# Patient Record
Sex: Male | Born: 1995 | Race: White | Hispanic: No | Marital: Single | Smoking: Never smoker
Health system: Southern US, Community
[De-identification: ages and names within clinical notes are randomized; demographics above are authoritative.]

## PROBLEM LIST (undated history)

## (undated) DIAGNOSIS — K5900 Constipation, unspecified: Secondary | ICD-10-CM

## (undated) DIAGNOSIS — F419 Anxiety disorder, unspecified: Secondary | ICD-10-CM

## (undated) DIAGNOSIS — K649 Unspecified hemorrhoids: Secondary | ICD-10-CM

## (undated) DIAGNOSIS — T7840XA Allergy, unspecified, initial encounter: Secondary | ICD-10-CM

## (undated) HISTORY — DX: Allergy, unspecified, initial encounter: T78.40XA

## (undated) HISTORY — DX: Anxiety disorder, unspecified: F41.9

## (undated) HISTORY — DX: Constipation, unspecified: K59.00

## (undated) HISTORY — DX: Unspecified hemorrhoids: K64.9

## (undated) HISTORY — PX: WISDOM TOOTH EXTRACTION: SHX21

---

## 2015-04-13 ENCOUNTER — Ambulatory Visit (INDEPENDENT_AMBULATORY_CARE_PROVIDER_SITE_OTHER): Payer: 59 | Admitting: Internal Medicine

## 2015-04-13 VITALS — BP 110/72 | HR 78 | Temp 98.2°F | Resp 18 | Ht 68.0 in | Wt 130.0 lb

## 2015-04-13 DIAGNOSIS — K5900 Constipation, unspecified: Secondary | ICD-10-CM

## 2015-04-13 DIAGNOSIS — K602 Anal fissure, unspecified: Secondary | ICD-10-CM

## 2015-04-13 MED ORDER — POLYETHYLENE GLYCOL 3350 17 GM/SCOOP PO POWD: 17.0000 g | Freq: Every day | ORAL | Status: DC | PRN

## 2015-04-13 NOTE — Progress Notes (Addendum)
[facility] [address], [place] [address] [phone]  Date:  04/13/2015   Name:  Raymond Huber   DOB:  [birth year]   MRN:  [id number]  PCP:  No PCP Per Patient    History of Present Illness:  Raymond Huber is a 20 y.o. male patient who presents to [facility] with cc of anal pain.  3 weeks of off and on bleeding.  On and off spotting with episodes of bowel movements.  He became concerned when his bleeding increased with difficulty to stop today.  It eventually had.  To note, this was blood that was found on the toilet paper, and not in stool.  The pain is tearing and poking like moreso with bowel movements.  He has constipation.  BM are qd to every other day.  He was not hydrating well, however started once this constipation and bleeding started 3 weeks ago.  He has no hx of abdominal pain.  No diarrhea or other episodes of blood in the stool.  No familial hx of IBD.  He is a Printmaker, living on campus--on meal plan.  He eats fastfood like items (pastas, french fries, hamburgers).  Rarely gets vegetables.  He has never had a problem with constipation prior to college experience.      Freshman at [employer]   There are no active problems to display for this patient.   Past Medical History  Diagnosis Date  . Allergy     History reviewed. No pertinent past surgical history.  Social History  Substance Use Topics  . Smoking status: Never Smoker   . Smokeless tobacco: None  . Alcohol Use: None    Family History  Problem Relation Age of Onset  . Cancer Paternal Grandmother   . Cancer Paternal Grandfather     No Known Allergies  Medication list has been reviewed and updated.  No current outpatient prescriptions on file prior to visit.   No current facility-administered medications on file prior to visit.    ROS ROS otherwise unremarkable unless listed above.  Physical Examination: BP 110/72 mmHg  Pulse 78  Temp(Src) 98.2 F (36.8 C)  (Oral)  Resp 18  Ht  (1.727 m)  Wt 130 lb (58.968 kg)  BMI 19.77 kg/m2  SpO2 98% Ideal Body Weight: Weight in (lb) to have BMI = 25: 164.1  Physical Exam  Constitutional: He is oriented to person, place, and time. He appears well-developed and well-nourished. No distress.  HENT:  Head: Normocephalic and atraumatic.  Eyes: Conjunctivae and EOM are normal. Pupils are equal, round, and reactive to light.  Cardiovascular: Normal rate.   Pulmonary/Chest: Effort normal. No respiratory distress.  Genitourinary: Rectal exam shows fissure (tear at the 4 o'clock region consistent with anal fissure.) and tenderness. Rectal exam shows no external hemorrhoid and no internal hemorrhoid.  Neurological: He is alert and oriented to person, place, and time.  Skin: Skin is warm and dry. He is not diaphoretic.  Psychiatric: He has a normal mood and affect. His behavior is normal.     Assessment and Plan: Raymond Huber is a 20 y.o. male who is here today  --advised fibrous diet, sitz bath, and given stool softener at this time. --given nifedipine gel tid prn for anus at this time.   --rtc in 7-10 days if no improvement. Anal fissure  Constipation, unspecified constipation type - Plan: polyethylene glycol powder (GLYCOLAX/MIRALAX) powder  Trena Platt, PA-C [facility] 04/13/2015 2:53 PM  I have participated in the care of this patient with the Advanced Practice Provider and agree with Diagnosis and Plan as documented. Robert P. Merla Richesoolittle, M.D.

## 2015-04-13 NOTE — Patient Instructions (Addendum)
IF you received an x-ray today, you will receive an invoice from [facility]. Please contact [facility] at [phone] with questions or concerns regarding your invoice.   IF you received labwork today, you will receive an invoice from [facility]. Please contact [facility] at [phone] with questions or concerns regarding your invoice.   Our billing staff will not be able to assist you with questions regarding bills from these companies.  You will be contacted with the lab results as soon as they are available. The fastest way to get your results is to activate your My Chart account. Instructions are located on the last page of this paperwork. If you have not heard from us regarding the results in 2 weeks, please contact this office.    Please try to include fiber in your diet.  I am including fibrous food for you to consider at this time.   I would like you to make sure that you are hydrating with 64 oz or more of water daily. Do warm sitz baths if you are able to at this time.   High-Fiber Diet Fiber, also called dietary fiber, is a type of carbohydrate found in fruits, vegetables, whole grains, and beans. A high-fiber diet can have many health benefits. Your health care provider may recommend a high-fiber diet to help:  Prevent constipation. Fiber can make your bowel movements more regular.  Lower your cholesterol.  Relieve hemorrhoids, uncomplicated diverticulosis, or irritable bowel syndrome.  Prevent overeating as part of a weight-loss plan.  Prevent heart disease, type 2 diabetes, and certain cancers. WHAT IS MY PLAN? The recommended daily intake of fiber includes:  38 grams for men under age 20.  30 grams for men over age 20.  25 grams for women under age 20.  21 grams for women over age 20. You can get the recommended daily intake of dietary fiber by eating a variety of fruits, vegetables, grains, and beans. Your health  care provider may also recommend a fiber supplement if it is not possible to get enough fiber through your diet. WHAT DO I NEED TO KNOW ABOUT A HIGH-FIBER DIET?  Fiber supplements have not been widely studied for their effectiveness, so it is better to get fiber through food sources.  Always check the fiber content on thenutrition facts label of any prepackaged food. Look for foods that contain at least 5 grams of fiber per serving.  Ask your dietitian if you have questions about specific foods that are related to your condition, especially if those foods are not listed in the following section.  Increase your daily fiber consumption gradually. Increasing your intake of dietary fiber too quickly may cause bloating, cramping, or gas.  Drink plenty of water. Water helps you to digest fiber. WHAT FOODS CAN I EAT? Grains Whole-grain breads. Multigrain cereal. Oats and oatmeal. Brown rice. Barley. Bulgur wheat. Millet. Bran muffins. Popcorn. Rye wafer crackers. Vegetables Sweet potatoes. Spinach. Kale. Artichokes. Cabbage. Broccoli. Green peas. Carrots. Squash. Fruits Berries. Pears. Apples. Oranges. Avocados. Prunes and raisins. Dried figs. Meats and Other Protein Sources Navy, kidney, pinto, and soy beans. Split peas. Lentils. Nuts and seeds. Dairy Fiber-fortified yogurt. Beverages Fiber-fortified soy milk. Fiber-fortified orange juice. Other Fiber bars. The items listed above may not be a complete list of recommended foods or beverages. Contact your dietitian for more options. WHAT FOODS ARE NOT RECOMMENDED? Grains White bread. Pasta made with refined flour. White rice. Vegetables Fried potatoes. Canned vegetables. Well-cooked vegetables.  Fruits Fruit  juice. Cooked, strained fruit. Meats and Other Protein Sources Fatty cuts of meat. Fried Environmental education officer or fried fish. Dairy Milk. Yogurt. Cream cheese. Sour cream. Beverages Soft drinks. Other Cakes and pastries. Butter and  oils. The items listed above may not be a complete list of foods and beverages to avoid. Contact your dietitian for more information. WHAT ARE SOME TIPS FOR INCLUDING HIGH-FIBER FOODS IN MY DIET?  Eat a wide variety of high-fiber foods.  Make sure that half of all grains consumed each day are whole grains.  Replace breads and cereals made from refined flour or white flour with whole-grain breads and cereals.  Replace white rice with brown rice, bulgur wheat, or millet.  Start the day with a breakfast that is high in fiber, such as a cereal that contains at least 5 grams of fiber per serving.  Use beans in place of meat in soups, salads, or pasta.  Eat high-fiber snacks, such as berries, raw vegetables, nuts, or popcorn.   This information is not intended to replace advice given to you by your health care provider. Make sure you discuss any questions you have with your health care provider.   Document Released: 12/19/2004 Document Revised: 01/09/2014 Document Reviewed: 06/03/2013 Elsevier Interactive Patient Education 2016 Elsevier Inc.  Anal Fissure, Adult An anal fissure is a small tear or crack in the skin around the opening of the butt (anus).Bleeding from the tear or crack usually stops on its own within a few minutes. The bleeding may happen every time you poop (have a bowel movement) until the tear or crack heals. HOME CARE Eating and Drinking  Avoid bananas and dairy products. These foods can make it hard to poop.  Drink enough fluid to keep your pee (urine) clear or pale yellow.  Eat a lot of fruit, whole grains, and vegetables. General Instructions  Keep the butt area as clean and dry as you can.  Take a warm water bath (sitz bath) as told by your doctor. Do not use soap.  Take over-the-counter and prescription medicines only as told by your doctor.  Use creams or ointments only as told by your doctor.  Keep all follow-up visits as told by your doctor. This is  important. GET HELP IF:  You have more bleeding.  You have a fever.  You have watery poop (diarrhea) that is mixed with blood.  You have pain.  You problem gets worse, not better.   This information is not intended to replace advice given to you by your health care provider. Make sure you discuss any questions you have with your health care provider.   Document Released: 08/17/2010 Document Revised: 09/09/2014 Document Reviewed: 03/16/2014 Elsevier Interactive Patient Education [employer]

## 2016-02-15 ENCOUNTER — Ambulatory Visit (INDEPENDENT_AMBULATORY_CARE_PROVIDER_SITE_OTHER): Payer: 59

## 2016-02-15 ENCOUNTER — Ambulatory Visit (INDEPENDENT_AMBULATORY_CARE_PROVIDER_SITE_OTHER): Payer: 59 | Admitting: Physician Assistant

## 2016-02-15 VITALS — BP 118/62 | HR 73 | Temp 97.8°F | Resp 18 | Ht 68.0 in | Wt 131.0 lb

## 2016-02-15 DIAGNOSIS — S99912A Unspecified injury of left ankle, initial encounter: Secondary | ICD-10-CM

## 2016-02-15 MED ORDER — MELOXICAM 15 MG PO TABS: 15.0000 mg | ORAL_TABLET | Freq: Every day | ORAL | 0 refills | Status: DC

## 2016-02-15 NOTE — Progress Notes (Signed)
   Raymond Oaksndrew Tuckey  MRN: [id number] DOB: 10/23/1995  Subjective:  Raymond Huber is a 21 y.o. male seen in office today for a chief complaint of left ankle foot injury x 5 days ago while playing basketball. Notes he inverted his left ankle and immediately heard a pop. Has associated swelling, bruising, pain and decreased ROM. Denies loss of sensation, numbness, and tingling. Pt has tried rest, ice, compression, and elevation.   Review of Systems  Constitutional: Negative for chills, diaphoresis, fatigue and fever.    There are no active problems to display for this patient.   No current outpatient prescriptions on file prior to visit.   No current facility-administered medications on file prior to visit.     No Known Allergies   Objective:  BP 118/62   Pulse 73   Temp 97.8 F (36.6 C) (Oral)   Resp 18   Ht 5\' 8"  (1.727 m)   Wt 131 lb (59.4 kg)   SpO2 100%   BMI 19.92 kg/m   Physical Exam  Constitutional: He is oriented to person, place, and time and well-developed, well-nourished, and in no distress.  HENT:  Head: Normocephalic and atraumatic.  Eyes: Conjunctivae are normal.  Neck: Normal range of motion.  Pulmonary/Chest: Effort normal.  Musculoskeletal:       Right ankle: Normal.       Left ankle: He exhibits decreased range of motion, swelling and ecchymosis (on lateral aspect). He exhibits normal pulse. Tenderness. Lateral malleolus, AITFL and head of 5th metatarsal tenderness found. Achilles tendon normal.       Right foot: Normal.       Left foot: There is normal capillary refill.  Neurological: He is alert and oriented to person, place, and time. Gait normal.  Skin: Skin is warm and dry.  Psychiatric: Affect normal.  Vitals reviewed.  Dg Ankle Complete Left  Result Date: 02/15/2016 CLINICAL DATA:  Inversion injury 5 days ago. EXAM: LEFT ANKLE COMPLETE - 3+ VIEW COMPARISON:  None. FINDINGS: There is no evidence of fracture, dislocation, or joint effusion.  There is no evidence of arthropathy or other focal bone abnormality. Soft tissues are unremarkable. IMPRESSION: Negative. Electronically Signed   By: Charlett NoseKevin  Dover M.D.   On: 02/15/2016 10:16   Dg Foot Complete Left  Result Date: 02/15/2016 CLINICAL DATA:  Foot inversion injury 5 days ago EXAM: LEFT FOOT - COMPLETE 3+ VIEW COMPARISON:  None. FINDINGS: There is no evidence of fracture or dislocation. There is no evidence of arthropathy or other focal bone abnormality. Soft tissues are unremarkable. IMPRESSION: Negative. Electronically Signed   By: Charlett NoseKevin  Dover M.D.   On: 02/15/2016 10:16    Assessment and Plan :  1. Injury of left ankle, initial encounter Plain films are reassuring, will treat as ankle sprain. Given educational material for stretches. ASO applied in office. Informed to continue rest, ice, and elevation. Instructed to return to clinic if symptoms worsen, do not improve in 10-14 days, or as needed - DG Foot Complete Left; Future - DG Ankle Complete Left; Future - meloxicam (MOBIC) 15 MG tablet; Take 1 tablet (15 mg total) by mouth daily.  Dispense: 30 tablet; Refill: 0  Benjiman CoreBrittany Surabhi Gadea PA-C  [facility] 02/15/2016 10:28 AM

## 2016-02-15 NOTE — Patient Instructions (Addendum)
Continue taking meloxicam, using ice to affected area, and using ankle brace until pain and swelling resolve. If you are still having pain in 10-14 days return to our clinic. If any symptoms worsen, seek care sooner. Thank you for letting me participate in your health and well being.   Ankle Sprain, Phase I Rehab Ask your health care provider which exercises are safe for you. Do exercises exactly as told by your health care provider and adjust them as directed. It is normal to feel mild stretching, pulling, tightness, or discomfort as you do these exercises, but you should stop right away if you feel sudden pain or your pain gets worse.Do not begin these exercises until told by your health care provider. Stretching and range of motion exercises These exercises warm up your muscles and joints and improve the movement and flexibility of your lower leg and ankle. These exercises also help to relieve pain and stiffness. Exercise A: Gastroc and soleus stretch 1. Sit on the floor with your left / right leg extended. 2. Loop a belt or towel around the ball of your left / right foot. The ball of your foot is on the walking surface, right under your toes. 3. Keep your left / right ankle and foot relaxed and keep your knee straight while you use the belt or towel to pull your foot toward you. You should feel a gentle stretch behind your calf or knee. 4. Hold this position for __________ seconds, then release to the starting position. Repeat the exercise with your knee bent. You can put a pillow or a rolled bath towel under your knee to support it. You should feel a stretch deep in your calf or at your Achilles tendon. Repeat each stretch __________ times. Complete these stretches __________ times a day. Exercise B: Ankle alphabet 1. Sit with your left / right leg supported at the lower leg.  Do not rest your foot on anything.  Make sure your foot has room to move freely. 2. Think of your left / right foot  as a paintbrush, and move your foot to trace each letter of the alphabet in the air. Keep your hip and knee still while you trace. Make the letters as large as you can without feeling discomfort. 3. Trace every letter from A to Z. Repeat __________ times. Complete this exercise __________ times a day. Strengthening exercises These exercises build strength and endurance in your ankle and lower leg. Endurance is the ability to use your muscles for a long time, even after they get tired. Exercise C: Dorsiflexors 1. Secure a rubber exercise band or tube to an object, such as a table leg, that will stay still when the band is pulled. Secure the other end around your left / right foot. 2. Sit on the floor facing the object, with your left / right leg extended. The band or tube should be slightly tense when your foot is relaxed. 3. Slowly bring your foot toward you, pulling the band tighter. 4. Hold this position for __________ seconds. 5. Slowly return your foot to the starting position. Repeat __________ times. Complete this exercise __________ times a day. Exercise D: Plantar flexors 1. Sit on the floor with your left / right leg extended. 2. Loop a rubber exercise tube or band around the ball of your left / right foot. The ball of your foot is on the walking surface, right under your toes.  Hold the ends of the band or tube in your hands.  The band or tube should be slightly tense when your foot is relaxed. 3. Slowly point your foot and toes downward, pushing them away from you. 4. Hold this position for __________ seconds. 5. Slowly return your foot to the starting position. Repeat __________ times. Complete this exercise __________ times a day. Exercise E: Evertors 1. Sit on the floor with your legs straight out in front of you. 2. Loop a rubber exercise band or tube around the ball of your left / right foot. The ball of your foot is on the walking surface, right under your toes.  Hold the  ends of the band in your hands, or secure the band to a stable object.  The band or tube should be slightly tense when your foot is relaxed. 3. Slowly push your foot outward, away from your other leg. 4. Hold this position for __________ seconds. 5. Slowly return your foot to the starting position. Repeat __________ times. Complete this exercise __________ times a day. This information is not intended to replace advice given to you by your health care provider. Make sure you discuss any questions you have with your health care provider. Document Released: 07/20/2004 Document Revised: 08/26/2015 Document Reviewed: 11/02/2014 Elsevier Interactive Patient Education  2017 [employer].    IF you received an x-ray today, you will receive an invoice from [facility]. Please contact [facility] at [phone] with questions or concerns regarding your invoice.   IF you received labwork today, you will receive an invoice from [place]. Please contact [facility] at [phone] with questions or concerns regarding your invoice.   Our billing staff will not be able to assist you with questions regarding bills from these companies.  You will be contacted with the lab results as soon as they are available. The fastest way to get your results is to activate your My Chart account. Instructions are located on the last page of this paperwork. If you have not heard from [place] regarding the results in 2 weeks, please contact this office.

## 2017-04-06 IMAGING — DX DG ANKLE COMPLETE 3+V*L*
4 series · 4 of 4 positions shown · non-contrast
Comparison: None.

CLINICAL DATA: Inversion injury 5 days ago.

EXAM:
LEFT ANKLE COMPLETE - 3+ VIEW

[ankle ap]
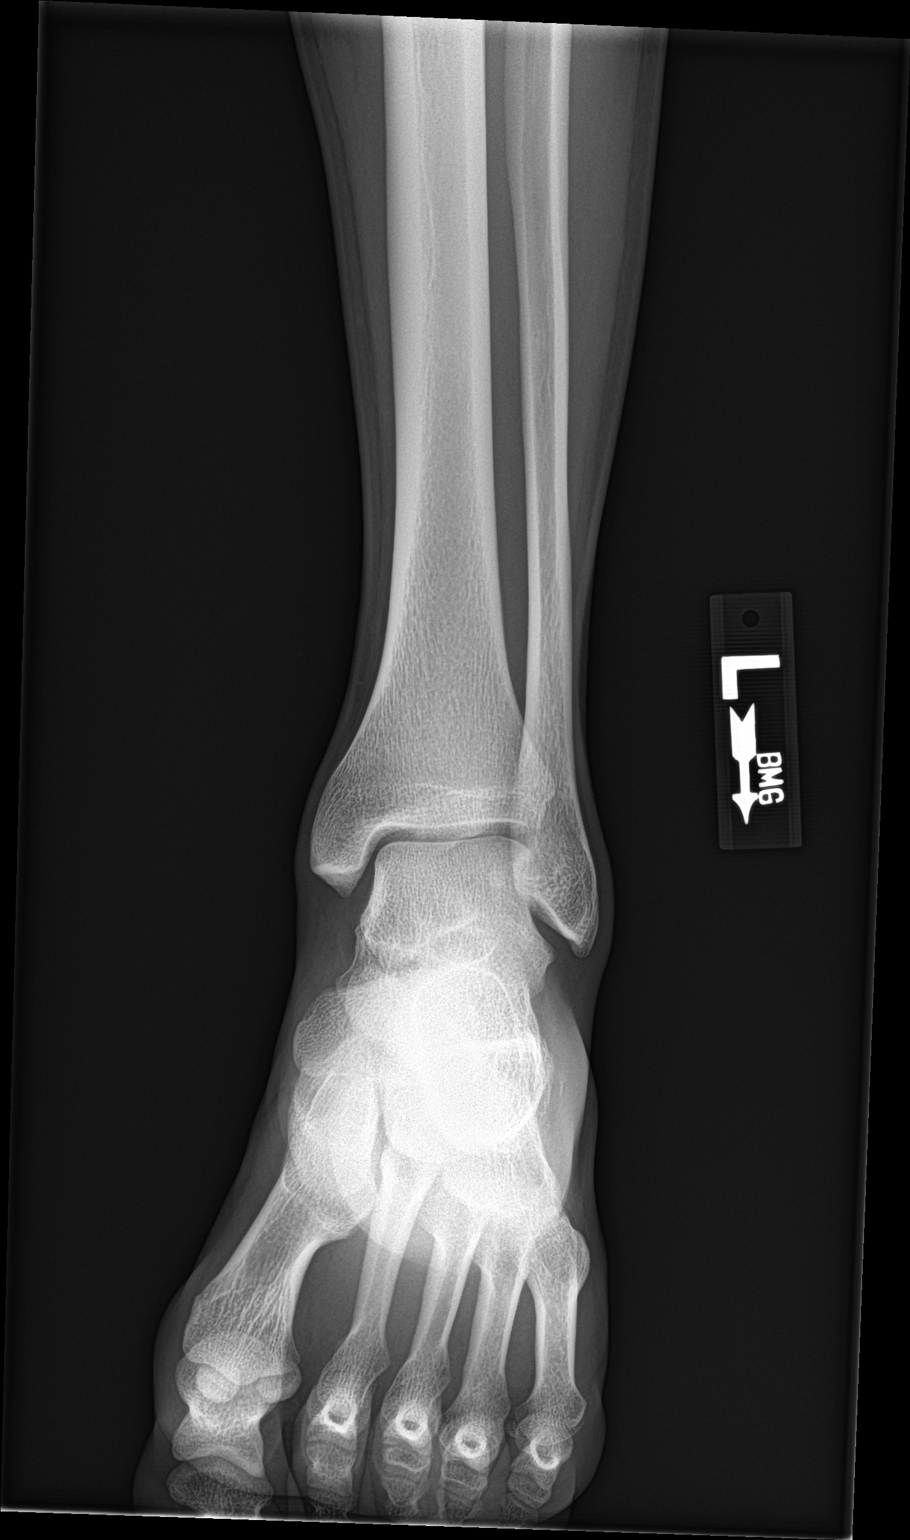

[ankle obl (1 of 2)]
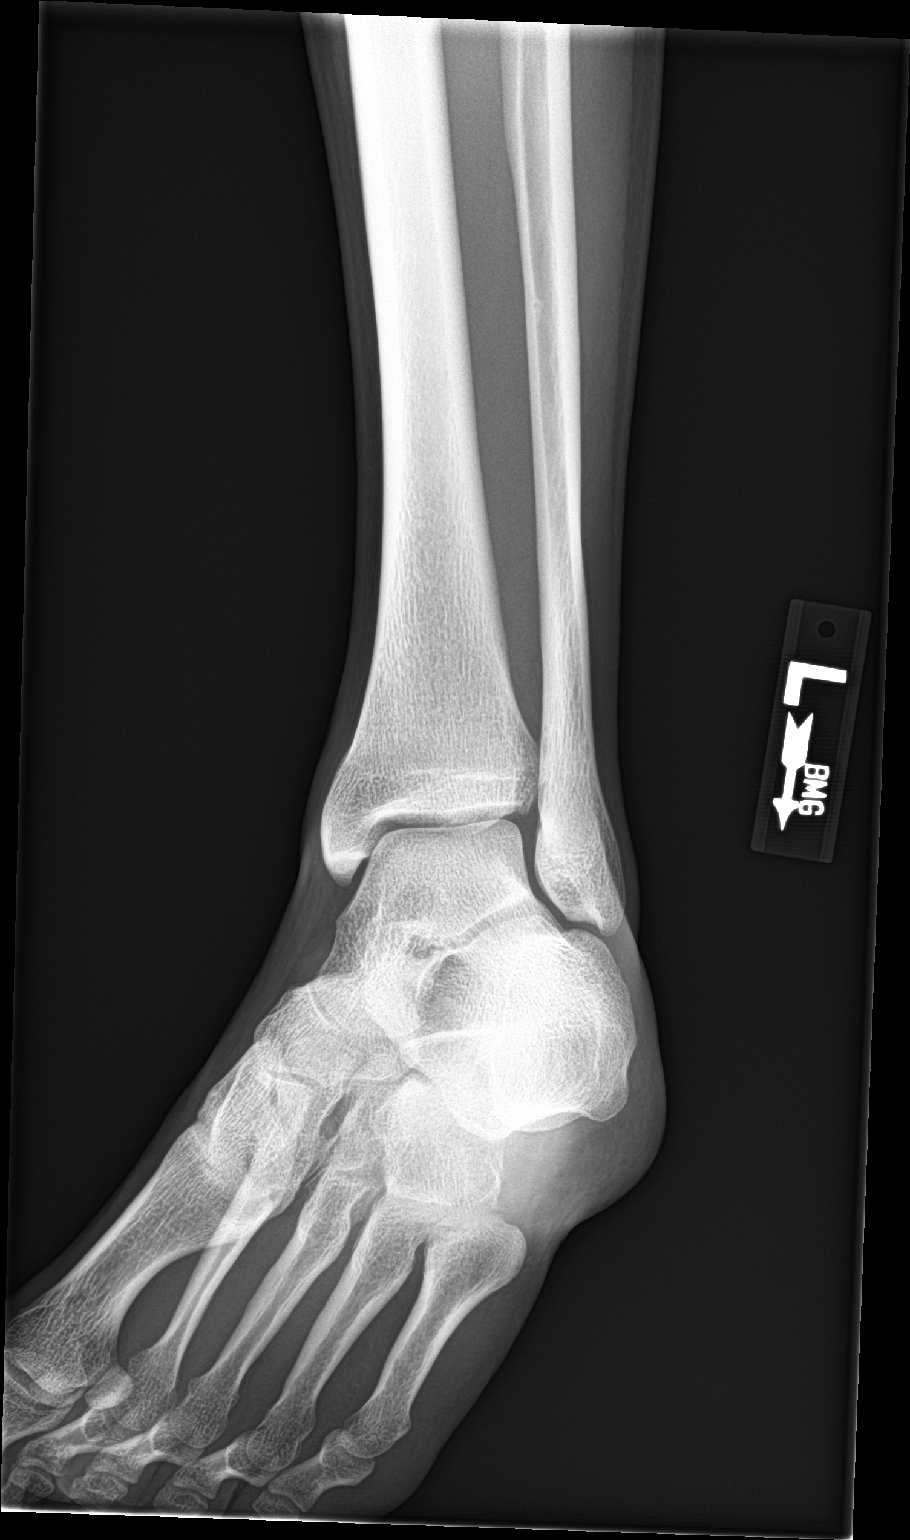

[ankle lat]
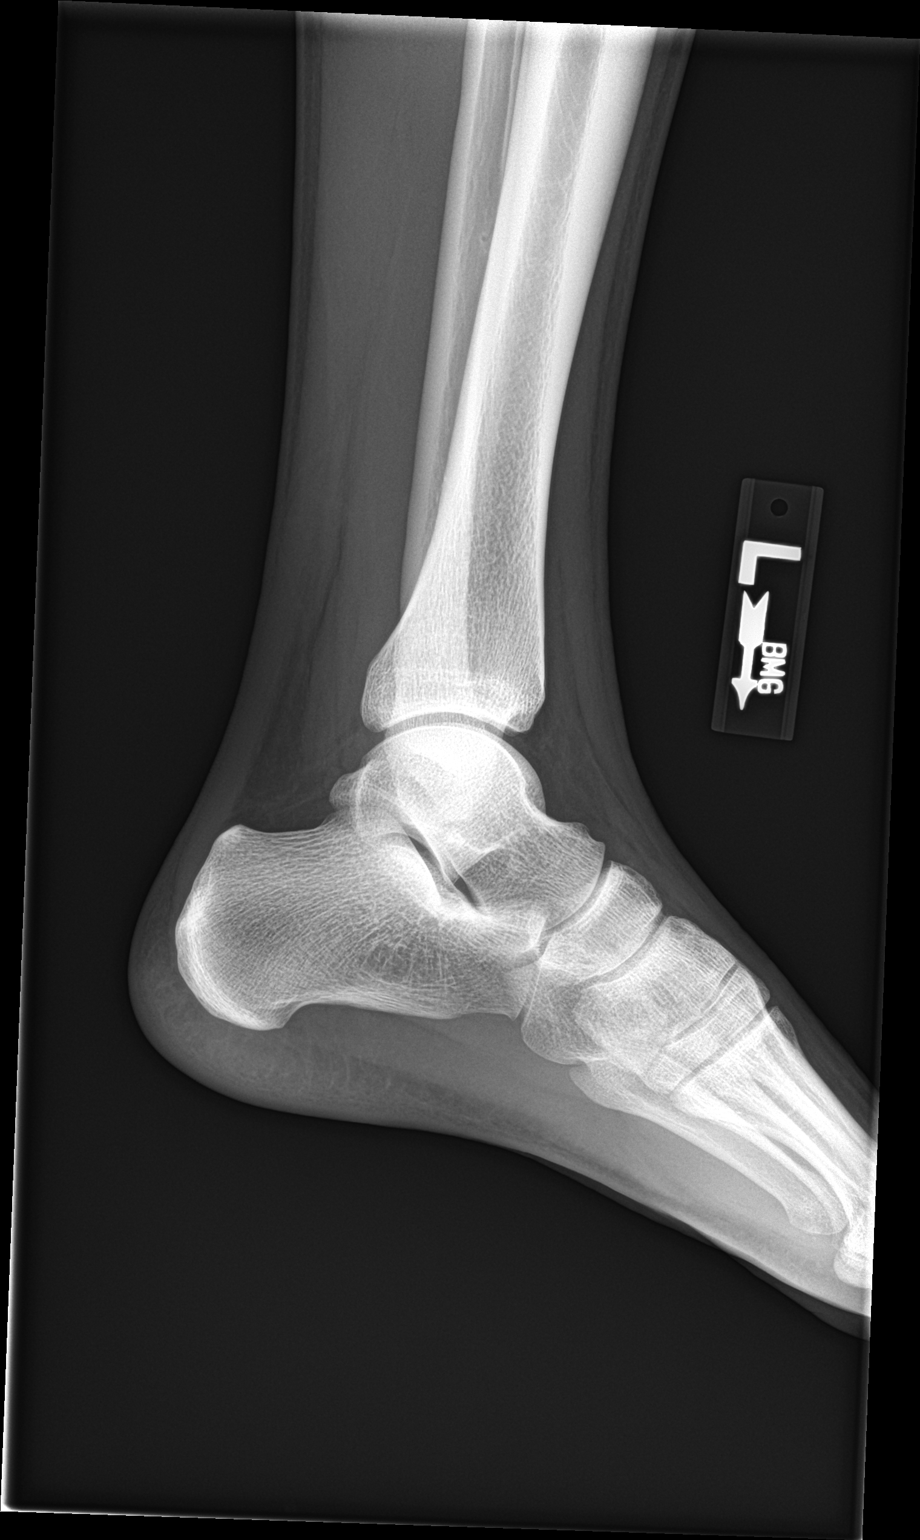

[ankle obl (2 of 2)]
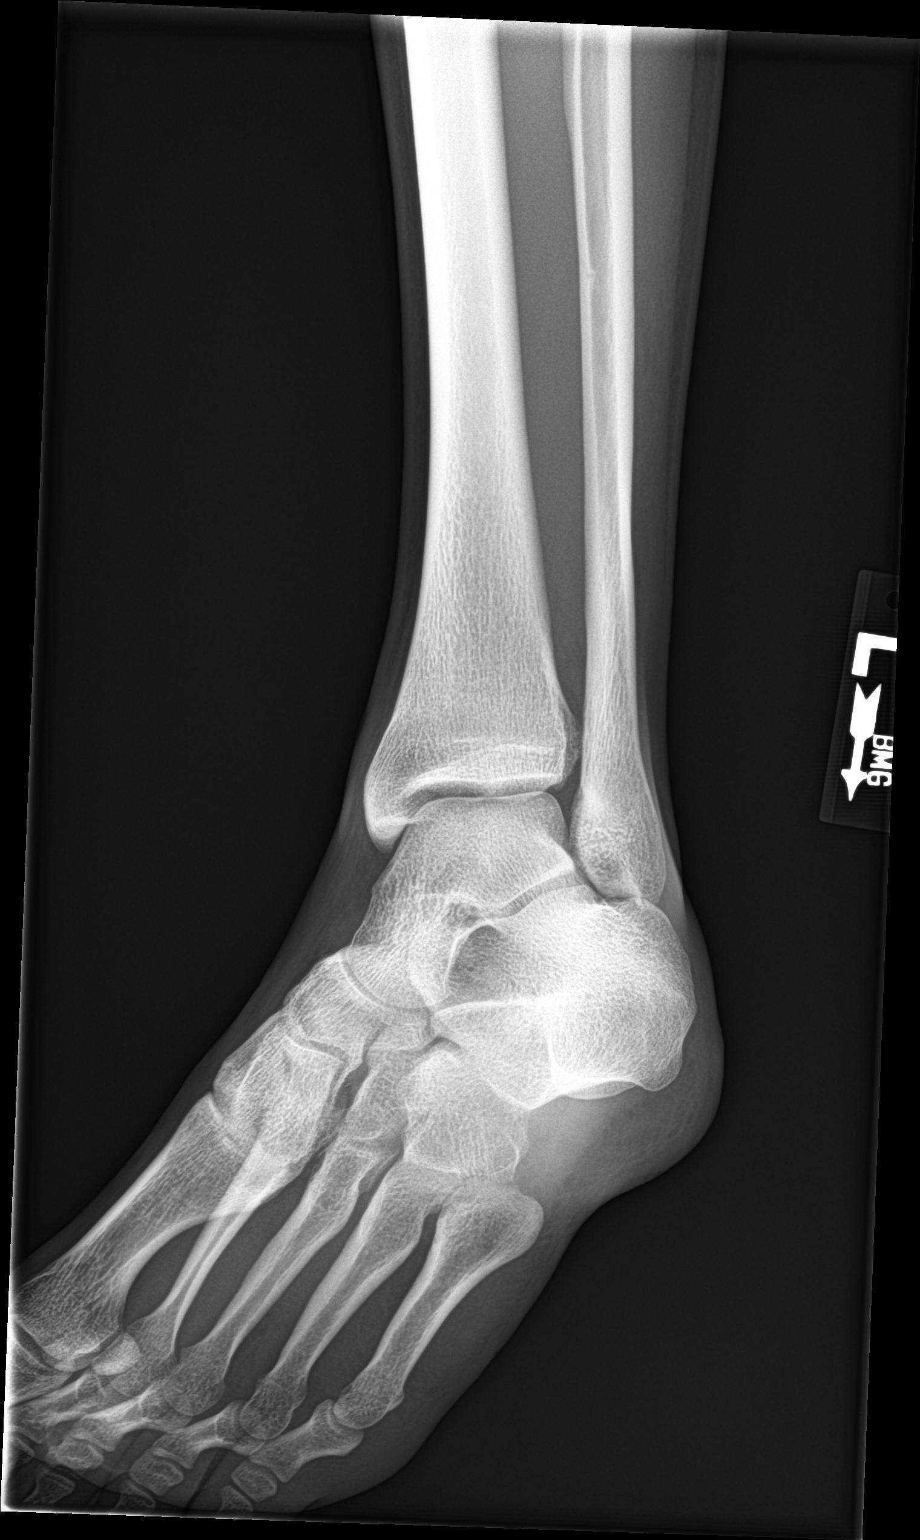

[4 of 4 positions shown; findings below may reference images not displayed]

FINDINGS: There is no evidence of fracture, dislocation, or joint effusion.
There is no evidence of arthropathy or other focal bone abnormality.
Soft tissues are unremarkable.
IMPRESSION: Negative.

## 2020-01-17 ENCOUNTER — Other Ambulatory Visit: Payer: Managed Care, Other (non HMO)

## 2020-01-17 DIAGNOSIS — Z20822 Contact with and (suspected) exposure to covid-19: Secondary | ICD-10-CM

## 2020-01-20 LAB — NOVEL CORONAVIRUS, NAA: SARS-CoV-2, NAA: NOT DETECTED

## 2021-03-01 NOTE — Progress Notes (Signed)
New Patient Office Visit  Subjective:  Patient ID: Raymond Huber, male    DOB: May 14, 1995  Age: 26 y.o. MRN: [id number]  CC:  Chief Complaint  Patient presents with   Establish Care    Np. Est care. Pt requesting CPE & blood work. Pt c/o chronic constipation x1 yr     HPI Raymond Huber presents for new patient visit to establish care.  Introduced to Designer, [employer]ellery role and practice setting.  All questions answered.  Discussed provider/patient relationship and expectations. This is his first time seeing a primary care provider in many years.   He has a history of constipation. In college he has a history of anal fissure. He was given a cream to use and stool softeners, however when he ran out of the medication, he stopped taking anything. The past 2 or 3 months he has been having constipation. He endorses having several bowel movements this week which helped his symptoms. He drinks plenty of water. He has intermittent, sharp, abdominal pain when he is constipated. He denies nausea and vomiting.   He was also diagnosed with social anxiety in college. He states that recently his anxiety has worsened.   ANXIETY/STRESS  Duration:uncontrolled Anxious mood: yes  Excessive worrying: yes Irritability:  at times   Sweating: no Nausea: no Palpitations:yes - with panic attacks Hyperventilation: yes - with panic attacks Panic attacks: yes - occasional Agoraphobia: yes  Obscessions/compulsions: no Depressed mood: yes Depression screen [facility] 2/9 03/02/2021 02/15/2016 04/13/2015  Decreased Interest 1 0 0  Down, Depressed, Hopeless 1 0 0  PHQ - 2 Score 2 0 0  Altered sleeping 1 - -  Tired, decreased energy 1 - -  Change in appetite 0 - -  Feeling bad or failure about yourself  1 - -  Trouble concentrating 3 - -  Moving slowly or fidgety/restless 1 - -  Suicidal thoughts 1 - -  PHQ-9 Score 10 - -  Difficult doing work/chores Very difficult - -   GAD 7 : Generalized Anxiety Score  03/02/2021  Nervous, Anxious, on Edge 3  Control/stop worrying 2  Worry too much - different things 3  Trouble relaxing 2  Restless 3  Easily annoyed or irritable 1  Afraid - awful might happen 1  Total GAD 7 Score 15  Anxiety Difficulty Somewhat difficult    Anhedonia: no Weight changes: yes weight loss, but recently had wisdom teeth removed Insomnia: yes hard to fall asleep  Hypersomnia: no Fatigue/loss of energy: no Feelings of worthlessness: yes Feelings of guilt: yes Impaired concentration/indecisiveness: yes Suicidal ideations: no  Crying spells: no Recent Stressors/Life Changes: yes  Past Medical History:  Diagnosis Date   Allergy    Anxiety    Constipation    Hemorrhoid     Past Surgical History:  Procedure Laterality Date   WISDOM TOOTH EXTRACTION Bilateral     Family History  Problem Relation Age of Onset   Mood Disorder Father    Cancer Paternal Grandmother        breast   Cancer Paternal Grandfather        pancreatic    Social History   Socioeconomic History   Marital status: Single    Spouse name: Not on file   Number of children: Not on file   Years of education: Not on file   Highest education level: Not on file  Occupational History   Not on file  Tobacco Use   Smoking status: Never   Smokeless  tobacco: Never  Vaping Use   Vaping Use: Former  Substance and Sexual Activity   Alcohol use: Yes    Alcohol/week: 1.0 - 2.0 standard drink    Types: 1 - 2 Cans of beer per week   Drug use: Yes    Types: Marijuana    Comment: CBD   Sexual activity: Not on file  Other Topics Concern   Not on file  Social History Narrative   Not on file   Social Determinants of Health   Financial Resource Strain: Not on file  Food Insecurity: Not on file  Transportation Needs: Not on file  Physical Activity: Not on file  Stress: Not on file  Social Connections: Not on file  Intimate Partner Violence: Not on file    ROS Review of Systems   Constitutional: Negative.   HENT: Negative.    Eyes: Negative.   Respiratory: Negative.    Cardiovascular: Negative.   Gastrointestinal:  Positive for abdominal pain (intermittent) and constipation. Negative for nausea and vomiting.  Endocrine: Positive for cold intolerance. Negative for polydipsia, polyphagia and polyuria.  Genitourinary: Negative.   Musculoskeletal: Negative.   Skin: Negative.   Neurological: Negative.   Psychiatric/Behavioral:  The patient is nervous/anxious.    Objective:   Today's Vitals: BP 111/85    Pulse 80    Temp 98.2 F (36.8 C) (Temporal)    Ht 5\' 8"  (1.727 m)    Wt 119 lb 12.8 oz (54.3 kg)    SpO2 99%    BMI 18.22 kg/m   Physical Exam Vitals and nursing note reviewed.  Constitutional:      Appearance: Normal appearance.  HENT:     Head: Normocephalic and atraumatic.     Right Ear: Tympanic membrane, ear canal and external ear normal.     Left Ear: Tympanic membrane, ear canal and external ear normal.     Nose: Nose normal.     Mouth/Throat:     Mouth: Mucous membranes are moist.     Pharynx: Oropharynx is clear.  Eyes:     Conjunctiva/sclera: Conjunctivae normal.  Cardiovascular:     Rate and Rhythm: Normal rate and regular rhythm.     Pulses: Normal pulses.     Heart sounds: Normal heart sounds.  Pulmonary:     Effort: Pulmonary effort is normal.     Breath sounds: Normal breath sounds.  Abdominal:     General: Bowel sounds are normal.     Palpations: Abdomen is soft.     Tenderness: There is no abdominal tenderness.  Musculoskeletal:        General: Normal range of motion.     Cervical back: Normal range of motion and neck supple. No tenderness.     Right lower leg: No edema.     Left lower leg: No edema.  Lymphadenopathy:     Cervical: No cervical adenopathy.  Skin:    General: Skin is warm and dry.  Neurological:     General: No focal deficit present.     Mental Status: He is alert and oriented to person, place, and time.      Cranial Nerves: No cranial nerve deficit.     Motor: No weakness.     Gait: Gait normal.     Deep Tendon Reflexes: Reflexes normal.  Psychiatric:        Mood and Affect: Mood normal.        Behavior: Behavior normal.        Thought Content:  Thought content normal.        Judgment: Judgment normal.    Assessment & Plan:   Problem List Items Addressed This Visit       Other   Anxiety    Uncontrolled. PHQ 9 is a 10 and GAD 7 is a 15.  Denies SI/HI.  He states that in the past he did have 1 time where he thought about harming himself but he called and talked to his family and since then has not had this happen.  He is able to contract for safety today.  Will start Zoloft 50 mg daily.  Start with half a tablet daily for 7 days then increase to 1 tablet daily.  He states that he has done therapy in the past and has access to online therapy.  We will check TSH today.  Follow-up in 4 weeks or sooner with concerns.      Relevant Medications   sertraline (ZOLOFT) 50 MG tablet   Other Relevant Orders   TSH   Depression, major, single episode, mild (HCC)    Uncontrolled. PHQ 9 is a 10 and GAD 7 is a 15.  Denies SI/HI.  He states that in the past he did have 1 time where he thought about harming himself but he called and talked to his family and since then has not had this happen.  He is able to contract for safety today.  Will start Zoloft 50 mg daily.  Start with half a tablet daily for 7 days then increase to 1 tablet daily.  He states that he has done therapy in the past and has access to online therapy.  Follow-up in 4 weeks or sooner with concerns.      Relevant Medications   sertraline (ZOLOFT) 50 MG tablet   Constipation    Chronic, ongoing.  He states his bowels have moved several times this week which have improved.  Encouraged him to eat foods high in fiber and drink plenty of water.  Also he can start taking MiraLAX 1 dose daily.  Follow-up with any concerns.      Other Visit  Diagnoses     Routine general medical examination at a health care facility    -  Primary   health maintenance reviewed and updated. Check CMP, CBC today. Updated HPV and flu vaccines.    Relevant Orders   CBC   Comprehensive metabolic panel   Encounter for lipid screening for cardiovascular disease       Check baseline lipid panel today   Relevant Orders   Lipid panel   Screen for STD (sexually transmitted disease)       Screen STD panel. Discussed safe sex with condom use.    Relevant Orders   HIV Antibody (routine testing w rflx)   RPR   HSV(herpes simplex vrs) 1+2 ab-IgG   Hepatitis C antibody   Urine cytology ancillary only   Need for HPV vaccine       First HPV vaccine given today.   Relevant Orders   HPV 9-valent vaccine,Recombinat (Completed)   Need for influenza vaccination       Flu vaccine given today.    Relevant Orders   Flu Vaccine QUAD 21mo+IM (Fluarix, Fluzone & Alfiuria Quad PF) (Completed)       Outpatient Encounter Medications as of 03/02/2021  Medication Sig   sertraline (ZOLOFT) 50 MG tablet Take 1 tablet (50 mg total) by mouth daily. Start 1/2 tablet daily for 7 days, then  take 1 tablet daily   [DISCONTINUED] amoxicillin (AMOXIL) 500 MG tablet Take 500 mg by mouth 3 (three) times daily. (Patient not taking: Reported on 03/02/2021)   [DISCONTINUED] meloxicam (MOBIC) 15 MG tablet Take 1 tablet (15 mg total) by mouth daily.   [DISCONTINUED] oxyCODONE-acetaminophen (PERCOCET/ROXICET) 5-325 MG tablet Take 1 tablet by mouth every 4 (four) hours as needed. (Patient not taking: Reported on 03/02/2021)   No facility-administered encounter medications on file as of 03/02/2021.    Follow-up: Return in about 4 weeks (around 03/30/2021) for Anxiety, Depression.   Charyl Dancer, NP

## 2021-03-02 ENCOUNTER — Ambulatory Visit (INDEPENDENT_AMBULATORY_CARE_PROVIDER_SITE_OTHER): Payer: 59 | Admitting: Nurse Practitioner

## 2021-03-02 ENCOUNTER — Other Ambulatory Visit (HOSPITAL_COMMUNITY)
Admission: RE | Admit: 2021-03-02 | Discharge: 2021-03-02 | Disposition: A | Discharge status: Home / Self Care | Payer: Managed Care, Other (non HMO) | Source: Ambulatory Visit | Attending: Nurse Practitioner | Admitting: Nurse Practitioner

## 2021-03-02 ENCOUNTER — Other Ambulatory Visit: Payer: Self-pay

## 2021-03-02 ENCOUNTER — Encounter: Payer: Self-pay | Admitting: Nurse Practitioner

## 2021-03-02 VITALS — BP 111/85 | HR 80 | Temp 98.2°F | Ht 68.0 in | Wt 119.8 lb

## 2021-03-02 DIAGNOSIS — Z136 Encounter for screening for cardiovascular disorders: Secondary | ICD-10-CM

## 2021-03-02 DIAGNOSIS — Z13220 Encounter for screening for lipoid disorders: Secondary | ICD-10-CM

## 2021-03-02 DIAGNOSIS — F419 Anxiety disorder, unspecified: Secondary | ICD-10-CM | POA: Diagnosis not present

## 2021-03-02 DIAGNOSIS — F320 Major depressive disorder, single episode, mild: Secondary | ICD-10-CM

## 2021-03-02 DIAGNOSIS — Z0000 Encounter for general adult medical examination without abnormal findings: Secondary | ICD-10-CM | POA: Diagnosis not present

## 2021-03-02 DIAGNOSIS — Z23 Encounter for immunization: Secondary | ICD-10-CM | POA: Diagnosis not present

## 2021-03-02 DIAGNOSIS — K5900 Constipation, unspecified: Secondary | ICD-10-CM

## 2021-03-02 DIAGNOSIS — Z113 Encounter for screening for infections with a predominantly sexual mode of transmission: Secondary | ICD-10-CM | POA: Diagnosis not present

## 2021-03-02 MED ORDER — SERTRALINE HCL 50 MG PO TABS: 50.0000 mg | ORAL_TABLET | Freq: Every day | ORAL | 2 refills | Status: DC

## 2021-03-02 NOTE — Patient Instructions (Signed)
It was great to see you! ? ?Start sertraline 1/2 tablet daily for 7 days then increase to 1 tablet daily. ? ?Make sure you are eating foods high in fiber and drinking plenty of water. Start miralax daily.  ? ?Let's follow-up in 4 weeks, sooner if you have concerns. ? ?If a referral was placed today, you will be contacted for an appointment. Please note that routine referrals can sometimes take up to 3-4 weeks to process. Please call our office if you haven't heard anything after this time frame. ? ?Take care, ? ?Rodman Pickle, NP ? ?

## 2021-03-03 DIAGNOSIS — F320 Major depressive disorder, single episode, mild: Secondary | ICD-10-CM | POA: Insufficient documentation

## 2021-03-03 DIAGNOSIS — F419 Anxiety disorder, unspecified: Secondary | ICD-10-CM | POA: Insufficient documentation

## 2021-03-03 DIAGNOSIS — K5900 Constipation, unspecified: Secondary | ICD-10-CM | POA: Insufficient documentation

## 2021-03-03 LAB — HEPATITIS C ANTIBODY
Hepatitis C Ab: NONREACTIVE
SIGNAL TO CUT-OFF: <0.02 (ref <1.00)

## 2021-03-03 LAB — HIV ANTIBODY (ROUTINE TESTING W REFLEX): HIV 1&2 Ab, 4th Generation: NONREACTIVE

## 2021-03-03 LAB — HSV(HERPES SIMPLEX VRS) I + II AB-IGG
HAV 1 IGG,TYPE SPECIFIC AB: <0.9 index
HSV 2 IGG,TYPE SPECIFIC AB: <0.9 index

## 2021-03-03 LAB — RPR: RPR Ser Ql: NONREACTIVE

## 2021-03-03 NOTE — Assessment & Plan Note (Signed)
Uncontrolled. PHQ 9 is a 10 and GAD 7 is a 15.  Denies SI/HI.  He states that in the past he did have 1 time where he thought about harming himself but he called and talked to his family and since then has not had this happen.  He is able to contract for safety today.  Will start Zoloft 50 mg daily.  Start with half a tablet daily for 7 days then increase to 1 tablet daily.  He states that he has done therapy in the past and has access to online therapy.  Follow-up in 4 weeks or sooner with concerns. ?

## 2021-03-03 NOTE — Assessment & Plan Note (Addendum)
Uncontrolled. PHQ 9 is a 10 and GAD 7 is a 15.  Denies SI/HI.  He states that in the past he did have 1 time where he thought about harming himself but he called and talked to his family and since then has not had this happen.  He is able to contract for safety today.  Will start Zoloft 50 mg daily.  Start with half a tablet daily for 7 days then increase to 1 tablet daily.  He states that he has done therapy in the past and has access to online therapy.  We will check TSH today.  Follow-up in 4 weeks or sooner with concerns. ?

## 2021-03-03 NOTE — Assessment & Plan Note (Signed)
Chronic, ongoing.  He states his bowels have moved several times this week which have improved.  Encouraged him to eat foods high in fiber and drink plenty of water.  Also he can start taking MiraLAX 1 dose daily.  Follow-up with any concerns. ?

## 2021-03-04 ENCOUNTER — Telehealth: Payer: Self-pay

## 2021-03-04 LAB — URINE CYTOLOGY ANCILLARY ONLY
Chlamydia: NEGATIVE
Comment: NEGATIVE
Comment: NORMAL
Neisseria Gonorrhea: NEGATIVE

## 2021-03-04 NOTE — Addendum Note (Signed)
Addended by: Varney Biles on: 03/04/2021 03:35 PM ? ? Modules accepted: Orders ? ?

## 2021-03-04 NOTE — Telephone Encounter (Signed)
Our main lab just called to tell us they cannot find the tubes we drew on 03/02/21. ?I called the patient and he said he would return for a redraw. ?Also, I put the orders not resulted back in as future. ? ?Marylene Land ?

## 2021-03-04 NOTE — Addendum Note (Signed)
Addended by: Varney Biles on: 03/04/2021 03:33 PM ? ? Modules accepted: Orders ? ?

## 2021-03-07 NOTE — Telephone Encounter (Signed)
Noted  

## 2021-03-31 NOTE — Progress Notes (Signed)
? ?Established Patient Office Visit ? ?Subjective:  ?Patient ID: Raymond Huber, male    DOB: Sep 27, 1995  Age: 26 y.o. MRN: [id number] ? ?CC:  ?Chief Complaint  ?Patient presents with  ? Follow-up  ?  Follow up for med refill. No concerns.  ? ? ?HPI ?Raymond Huber presents for follow-up on depression and anxiety.  Last visit he was started on sertraline 50 mg daily.  He states that he has noticed a significant improvement in his anxiety when he goes out in public.  He was having some fatigue at first but this is kind of gone away.  He does note that he is still having lack of motivation at times, and trouble concentrating.  He denies SI/HI.  Overall his symptoms have improved, however this lack of motivation is concerning to him. ? ? ?  04/01/2021  ?  5:06 PM 03/02/2021  ?  4:10 PM 02/15/2016  ?  9:40 AM 04/13/2015  ?  2:49 PM  ?Depression screen PHQ 2/9  ?Decreased Interest 3 1 0 0  ?Down, Depressed, Hopeless 1 1 0 0  ?PHQ - 2 Score 4 2 0 0  ?Altered sleeping 1 1    ?Tired, decreased energy 2 1    ?Change in appetite 0 0    ?Feeling bad or failure about yourself  0 1    ?Trouble concentrating 3 3    ?Moving slowly or fidgety/restless 0 1    ?Suicidal thoughts 0 1    ?PHQ-9 Score 10 10    ?Difficult doing work/chores Somewhat difficult Very difficult    ? ? ?  04/01/2021  ?  5:06 PM 03/02/2021  ?  4:10 PM  ?GAD 7 : Generalized Anxiety Score  ?Nervous, Anxious, on Edge 1 3  ?Control/stop worrying 0 2  ?Worry too much - different things 0 3  ?Trouble relaxing 1 2  ?Restless 1 3  ?Easily annoyed or irritable 0 1  ?Afraid - awful might happen 0 1  ?Total GAD 7 Score 3 15  ?Anxiety Difficulty Somewhat difficult Somewhat difficult  ? ? ?Past Medical History:  ?Diagnosis Date  ? Allergy   ? Anxiety   ? Constipation   ? Hemorrhoid   ? ? ?Past Surgical History:  ?Procedure Laterality Date  ? WISDOM TOOTH EXTRACTION Bilateral   ? ? ?Family History  ?Problem Relation Age of Onset  ? Mood Disorder Father   ? Cancer Paternal  Grandmother   ?     breast  ? Cancer Paternal Grandfather   ?     pancreatic  ? ? ?Social History  ? ?Socioeconomic History  ? Marital status: Single  ?  Spouse name: Not on file  ? Number of children: Not on file  ? Years of education: Not on file  ? Highest education level: Not on file  ?Occupational History  ? Not on file  ?Tobacco Use  ? Smoking status: Never  ? Smokeless tobacco: Never  ?Vaping Use  ? Vaping Use: Former  ?Substance and Sexual Activity  ? Alcohol use: Yes  ?  Alcohol/week: 1.0 - 2.0 standard drink  ?  Types: 1 - 2 Cans of beer per week  ? Drug use: Yes  ?  Types: Marijuana  ?  Comment: CBD  ? Sexual activity: Not on file  ?Other Topics Concern  ? Not on file  ?Social History Narrative  ? Not on file  ? ?Social Determinants of Health  ? ?Financial Resource Strain: Not on  file  ?Food Insecurity: Not on file  ?Transportation Needs: Not on file  ?Physical Activity: Not on file  ?Stress: Not on file  ?Social Connections: Not on file  ?Intimate Partner Violence: Not on file  ? ? ?Outpatient Medications Prior to Visit  ?Medication Sig Dispense Refill  ? sertraline (ZOLOFT) 50 MG tablet Take 1 tablet (50 mg total) by mouth daily. Start 1/2 tablet daily for 7 days, then take 1 tablet daily 30 tablet 2  ? ?No facility-administered medications prior to visit.  ? ? ?No Known Allergies ? ?ROS ?Review of Systems ?See pertinent positives and negatives per HPI. ? ?  ?Objective:  ?  ?Physical Exam ?Vitals and nursing note reviewed.  ?Constitutional:   ?   General: He is not in acute distress. ?   Appearance: Normal appearance.  ?HENT:  ?   Head: Normocephalic.  ?Eyes:  ?   Conjunctiva/sclera: Conjunctivae normal.  ?Cardiovascular:  ?   Rate and Rhythm: Normal rate and regular rhythm.  ?   Pulses: Normal pulses.  ?   Heart sounds: Normal heart sounds.  ?Pulmonary:  ?   Effort: Pulmonary effort is normal.  ?   Breath sounds: Normal breath sounds.  ?Musculoskeletal:  ?   Cervical back: Normal range of motion.   ?Skin: ?   General: Skin is warm and dry.  ?Neurological:  ?   General: No focal deficit present.  ?   Mental Status: He is alert and oriented to person, place, and time.  ?Psychiatric:     ?   Mood and Affect: Mood normal.     ?   Behavior: Behavior normal.     ?   Thought Content: Thought content normal.     ?   Judgment: Judgment normal.  ? ? ?BP 112/61 (BP Location: Left Arm, Patient Position: Sitting, Cuff Size: Normal)   Pulse 64   Temp 98.5 ?F (36.9 ?C) (Temporal)   Ht 5' 8"  (1.727 m)   Wt 124 lb 12.8 oz (56.6 kg)   SpO2 97%   BMI 18.98 kg/m?  ?Wt Readings from Last 3 Encounters:  ?04/01/21 124 lb 12.8 oz (56.6 kg)  ?03/02/21 119 lb 12.8 oz (54.3 kg)  ?02/15/16 131 lb (59.4 kg)  ? ? ? ?Health Maintenance Due  ?Topic Date Due  ? TETANUS/TDAP  09/25/2016  ? HPV VACCINES (2 - Male 3-dose series) 03/30/2021  ? ? ?   ?Topic Date Due  ? HPV VACCINES (2 - Male 3-dose series) 03/30/2021  ? ? ?No results found for: TSH ?No results found for: WBC, HGB, HCT, MCV, PLT ?No results found for: NA, K, CHLORIDE, CO2, GLUCOSE, BUN, CREATININE, BILITOT, ALKPHOS, AST, ALT, PROT, ALBUMIN, CALCIUM, ANIONGAP, EGFR, GFR ?No results found for: CHOL ?No results found for: HDL ?No results found for: [place] ?No results found for: TRIG ?No results found for: CHOLHDL ?No results found for: HGBA1C ? ?  ?Assessment & Plan:  ? ?Problem List Items Addressed This Visit   ? ?  ? Other  ? Anxiety  ?  Chronic, improving.  His anxiety has improved significantly since starting the sertraline 50 mg daily.  We will continue this regimen.  His GAD-7 has decreased from 15 to 3.  Adding Wellbutrin as mentioned above for depression.  Follow-up in 4 to 6 weeks. ?  ?  ? Relevant Medications  ? buPROPion (WELLBUTRIN XL) 150 MG 24 hr tablet  ? Depression, major, single episode, mild ([place]) - Primary  ?  Depression symptoms and lack of motivation are still present.  His PHQ 9 score is a 10 which is unchanged from last visit.  He denies SI/HI.  We will  start Wellbutrin 150 mg extended release daily.  Discussed that this may cause decrease in appetite and with his weight I encouraged him to make sure that he eats regularly.  We will monitor his weight at next visit.  Follow-up in 4 to 6 weeks. ?  ?  ? Relevant Medications  ? buPROPion (WELLBUTRIN XL) 150 MG 24 hr tablet  ? ?Other Visit Diagnoses   ? ? Encounter for lipid screening for cardiovascular disease      ? Check baseline lipid panel today  ? ?  ? ? ?Meds ordered this encounter  ?Medications  ? buPROPion (WELLBUTRIN XL) 150 MG 24 hr tablet  ?  Sig: Take 1 tablet (150 mg total) by mouth daily.  ?  Dispense:  30 tablet  ?  Refill:  1  ? ? ?Follow-up: Return in about 4 weeks (around 04/29/2021) for 4-6 weeks , Anxiety, Depression.  ? ? ?Charyl Dancer, NP ?

## 2021-04-01 ENCOUNTER — Encounter: Payer: Self-pay | Admitting: Nurse Practitioner

## 2021-04-01 ENCOUNTER — Ambulatory Visit (INDEPENDENT_AMBULATORY_CARE_PROVIDER_SITE_OTHER): Payer: Managed Care, Other (non HMO) | Admitting: Nurse Practitioner

## 2021-04-01 VITALS — BP 112/61 | HR 64 | Temp 98.5°F | Ht 68.0 in | Wt 124.8 lb

## 2021-04-01 DIAGNOSIS — F419 Anxiety disorder, unspecified: Secondary | ICD-10-CM

## 2021-04-01 DIAGNOSIS — Z13220 Encounter for screening for lipoid disorders: Secondary | ICD-10-CM | POA: Diagnosis not present

## 2021-04-01 DIAGNOSIS — F320 Major depressive disorder, single episode, mild: Secondary | ICD-10-CM | POA: Diagnosis not present

## 2021-04-01 DIAGNOSIS — Z136 Encounter for screening for cardiovascular disorders: Secondary | ICD-10-CM | POA: Diagnosis not present

## 2021-04-01 MED ORDER — BUPROPION HCL ER (XL) 150 MG PO TB24: 150.0000 mg | ORAL_TABLET | Freq: Every day | ORAL | 1 refills | Status: DC

## 2021-04-01 NOTE — Addendum Note (Signed)
Addended by: Renaldo Reel S on: 04/01/2021 04:51 PM ? ? Modules accepted: Orders ? ?

## 2021-04-01 NOTE — Assessment & Plan Note (Signed)
Depression symptoms and lack of motivation are still present.  His PHQ 9 score is a 10 which is unchanged from last visit.  He denies SI/HI.  We will start Wellbutrin 150 mg extended release daily.  Discussed that this may cause decrease in appetite and with his weight I encouraged him to make sure that he eats regularly.  We will monitor his weight at next visit.  Follow-up in 4 to 6 weeks. ?

## 2021-04-01 NOTE — Assessment & Plan Note (Signed)
Chronic, improving.  His anxiety has improved significantly since starting the sertraline 50 mg daily.  We will continue this regimen.  His GAD-7 has decreased from 15 to 3.  Adding Wellbutrin as mentioned above for depression.  Follow-up in 4 to 6 weeks. ?

## 2021-04-01 NOTE — Patient Instructions (Signed)
It was great to see you! ? ?Start wellbutrin 1 tablet daily. I recommend you take this in the morning as it can increase energy. Keep taking the zoloft.  ? ?Let's follow-up in 4-6 weeks, sooner if you have concerns. ? ?If a referral was placed today, you will be contacted for an appointment. Please note that routine referrals can sometimes take up to 3-4 weeks to process. Please call our office if you haven't heard anything after this time frame. ? ?Take care, ? ?Rodman Pickle, NP ? ?

## 2021-04-02 LAB — CBC
HCT: 42.2 % (ref 38.5–50.0)
Hemoglobin: 14 g/dL (ref 13.2–17.1)
MCH: 29.2 pg (ref 27.0–33.0)
MCHC: 33.2 g/dL (ref 32.0–36.0)
MCV: 88.1 fL (ref 80.0–100.0)
MPV: 10.5 fL (ref 7.5–12.5)
Platelets: 203 10*3/uL (ref 140–400)
RBC: 4.79 10*6/uL (ref 4.20–5.80)
RDW: 12 % (ref 11.0–15.0)
WBC: 6.2 10*3/uL (ref 3.8–10.8)

## 2021-04-02 LAB — COMPREHENSIVE METABOLIC PANEL
AG Ratio: 2.3 (calc) (ref 1.0–2.5)
ALT: 15 U/L (ref 9–46)
AST: 17 U/L (ref 10–40)
Albumin: 5 g/dL (ref 3.6–5.1)
Alkaline phosphatase (APISO): 59 U/L (ref 36–130)
BUN: 9 mg/dL (ref 7–25)
CO2: 24 mmol/L (ref 20–32)
Calcium: 10 mg/dL (ref 8.6–10.3)
Chloride: 101 mmol/L (ref 98–110)
Creat: 0.69 mg/dL (ref 0.60–1.24)
Globulin: 2.2 g/dL (calc) (ref 1.9–3.7)
Glucose, Bld: 86 mg/dL (ref 65–99)
Potassium: 4.4 mmol/L (ref 3.5–5.3)
Sodium: 139 mmol/L (ref 135–146)
Total Bilirubin: 0.5 mg/dL (ref 0.2–1.2)
Total Protein: 7.2 g/dL (ref 6.1–8.1)

## 2021-04-02 LAB — LIPID PANEL
Cholesterol: 128 mg/dL (ref <200)
HDL: 53 mg/dL (ref >=40)
LDL Cholesterol (Calc): 59 mg/dL (calc)
Non-HDL Cholesterol (Calc): 75 mg/dL (calc) (ref <130)
Total CHOL/HDL Ratio: 2.4 (calc) (ref <5.0)
Triglycerides: 84 mg/dL (ref <150)

## 2021-04-02 LAB — TSH: TSH: 1.44 mIU/L (ref 0.40–4.50)

## 2021-04-23 ENCOUNTER — Encounter: Payer: Self-pay | Admitting: Nurse Practitioner

## 2021-04-25 MED ORDER — SERTRALINE HCL 100 MG PO TABS: 100.0000 mg | ORAL_TABLET | Freq: Every day | ORAL | 1 refills | Status: DC

## 2021-05-04 ENCOUNTER — Other Ambulatory Visit: Payer: Self-pay | Admitting: Nurse Practitioner

## 2021-05-23 NOTE — Progress Notes (Unsigned)
   Established Patient Office Visit  Subjective   Patient ID: Draylen Lobue, male    DOB: 1995/07/06  Age: 26 y.o. MRN: [id number]  No chief complaint on file.   HPI  Hoa is here today to follow-up on anxiety and depression. He was started on bupropion 150mg  daily and increased his sertraline to 100mg  daily.   {History (Optional):23778}  ROS    Objective:     There were no vitals taken for this visit. {Vitals History (Optional):23777}  Physical Exam   No results found for any visits on 05/24/21.  {Labs (Optional):23779}  The ASCVD Risk score (Arnett DK, et al., 2019) failed to calculate for the following reasons:   The 2019 ASCVD risk score is only valid for ages 46 to 69    Assessment & Plan:   Problem List Items Addressed This Visit   None   No follow-ups on file.    41, NP

## 2021-05-24 ENCOUNTER — Ambulatory Visit (INDEPENDENT_AMBULATORY_CARE_PROVIDER_SITE_OTHER): Payer: 59 | Admitting: Nurse Practitioner

## 2021-05-24 ENCOUNTER — Encounter: Payer: Self-pay | Admitting: Nurse Practitioner

## 2021-05-24 VITALS — BP 116/66 | HR 53 | Temp 97.4°F | Wt 129.8 lb

## 2021-05-24 DIAGNOSIS — F320 Major depressive disorder, single episode, mild: Secondary | ICD-10-CM | POA: Diagnosis not present

## 2021-05-24 DIAGNOSIS — F419 Anxiety disorder, unspecified: Secondary | ICD-10-CM

## 2021-05-24 DIAGNOSIS — Z23 Encounter for immunization: Secondary | ICD-10-CM | POA: Diagnosis not present

## 2021-05-24 MED ORDER — BUPROPION HCL ER (XL) 150 MG PO TB24: 150.0000 mg | ORAL_TABLET | Freq: Every day | ORAL | 1 refills | Status: DC

## 2021-05-24 MED ORDER — SERTRALINE HCL 100 MG PO TABS: 100.0000 mg | ORAL_TABLET | Freq: Every day | ORAL | 1 refills | Status: DC

## 2021-05-24 NOTE — Patient Instructions (Signed)
It was great to see you!  Continue your zoloft and wellbutrin. Let me know if the side effects become bothersome.   Let's follow-up in 6 months, sooner if you have concerns.  If a referral was placed today, you will be contacted for an appointment. Please note that routine referrals can sometimes take up to 3-4 weeks to process. Please call our office if you haven't heard anything after this time frame.  Take care,  Rodman Pickle, NP

## 2021-05-24 NOTE — Assessment & Plan Note (Signed)
Chronic, controlled.  He states that since increasing the sertraline to 100 mg daily and starting the Wellbutrin 150 mg daily his anxiety and depression has been under great control.  He is having trouble with anorgasmia, however he would like to continue this medication right now since it is working so well and see if this side effect goes away.  Discussed that this is most likely a side effect from one of the medications and we can try a different medication in the future if he would like to.  Follow-up in 6 months or sooner with concerns.

## 2021-06-28 ENCOUNTER — Other Ambulatory Visit: Payer: Self-pay

## 2021-06-30 ENCOUNTER — Encounter: Payer: Self-pay | Admitting: Nurse Practitioner

## 2021-06-30 ENCOUNTER — Ambulatory Visit (INDEPENDENT_AMBULATORY_CARE_PROVIDER_SITE_OTHER): Payer: Managed Care, Other (non HMO) | Admitting: Nurse Practitioner

## 2021-06-30 VITALS — BP 102/80 | HR 58 | Temp 97.9°F | Wt 129.4 lb

## 2021-06-30 DIAGNOSIS — F419 Anxiety disorder, unspecified: Secondary | ICD-10-CM | POA: Diagnosis not present

## 2021-06-30 DIAGNOSIS — F320 Major depressive disorder, single episode, mild: Secondary | ICD-10-CM | POA: Diagnosis not present

## 2021-06-30 MED ORDER — ESCITALOPRAM OXALATE 10 MG PO TABS: 10.0000 mg | ORAL_TABLET | Freq: Every day | ORAL | 1 refills | Status: DC

## 2021-06-30 NOTE — Assessment & Plan Note (Signed)
He states that over the past 2 weeks, his symptoms of anxiety and depression have worsened.  His GAD-7 went from a 2 to an 2.  He is having some more jitteriness.  He states that since he started the sertraline, he is unable to achieve orgasm.  We will change his sertraline to Lexapro 10 mg daily.  Follow-up in 4 to 6 weeks, may be virtual

## 2021-06-30 NOTE — Progress Notes (Signed)
Established Patient Office Visit  Subjective   Patient ID: Raymond Huber, male    DOB: [birth year]  Age: 26 y.o. MRN: [id number]  Chief Complaint  Patient presents with   Follow-up    Med review f/u.     HPI  Raymond Huber is here to follow-up on depression and anxiety. He is starting to have more depressive thoughts along with some thoughts of harming himself. He states that he would never actually follow through on these thoughts.  He is also noticing some more shaking in his legs and arms from anxiety.  He is concerned that the symptoms have come back and thinks that he might need to make some medication changes.     06/30/2021    4:23 PM 05/24/2021   10:15 AM 04/01/2021    5:06 PM 03/02/2021    4:10 PM 02/15/2016    9:40 AM  Depression screen PHQ 2/9  Decreased Interest 0 1 3 1  0  Down, Depressed, Hopeless 3 1 1 1  0  PHQ - 2 Score 3 2 4 2  0  Altered sleeping 0 0 1 1   Tired, decreased energy 1 1 2 1    Change in appetite 1 0 0 0   Feeling bad or failure about yourself  2 0 0 1   Trouble concentrating 0 0 3 3   Moving slowly or fidgety/restless 1 0 0 1   Suicidal thoughts 3 0 0 1   PHQ-9 Score 11 3 10 10    Difficult doing work/chores Somewhat difficult Not difficult at all Somewhat difficult Very difficult       06/30/2021    4:32 PM 05/24/2021   10:15 AM 04/01/2021    5:06 PM 03/02/2021    4:10 PM  GAD 7 : Generalized Anxiety Score  Nervous, Anxious, on Edge 3 1 1 3   Control/stop worrying 1 0 0 2  Worry too much - different things 3 0 0 3  Trouble relaxing 1 0 1 2  Restless 2 1 1 3   Easily annoyed or irritable 1 0 0 1  Afraid - awful might happen 0 0 0 1  Total GAD 7 Score 11 2 3 15   Anxiety Difficulty Somewhat difficult Not difficult at all Somewhat difficult Somewhat difficult   Past Medical History:  Diagnosis Date   Allergy    Anxiety    Constipation    Hemorrhoid    Past Surgical History:  Procedure Laterality Date   WISDOM TOOTH EXTRACTION Bilateral     ROS See pertinent positives and negatives per HPI.    Objective:     BP 102/80   Pulse (!) 58   Temp 97.9 F (36.6 C) (Temporal)   Wt 129 lb 6.4 oz (58.7 kg)   SpO2 98%   BMI 19.68 kg/m    Physical Exam Vitals and nursing note reviewed.  Constitutional:      Appearance: Normal appearance.  HENT:     Head: Normocephalic.  Eyes:     Conjunctiva/sclera: Conjunctivae normal.  Cardiovascular:     Rate and Rhythm: Normal rate and regular rhythm.     Pulses: Normal pulses.     Heart sounds: Normal heart sounds.  Pulmonary:     Effort: Pulmonary effort is normal.     Breath sounds: Normal breath sounds.  Musculoskeletal:     Cervical back: Normal range of motion.  Skin:    General: Skin is warm.  Neurological:     General: No focal deficit present.  Mental Status: He is alert and oriented to person, place, and time.  Psychiatric:        Mood and Affect: Mood normal.        Behavior: Behavior normal.        Thought Content: Thought content normal.        Judgment: Judgment normal.      Assessment & Plan:   Problem List Items Addressed This Visit       Other   Anxiety    He states that over the past 2 weeks, his symptoms of anxiety and depression have worsened.  His GAD-7 went from a 2 to an 47.  He is having some more jitteriness.  He states that since he started the sertraline, he is unable to achieve orgasm.  We will change his sertraline to Lexapro 10 mg daily.  Follow-up in 4 to 6 weeks, may be virtual      Relevant Medications   escitalopram (LEXAPRO) 10 MG tablet   Depression, major, single episode, mild (HCC) - Primary    Chronic, worsened.  He states that over the past 2 weeks his symptoms of depression have worsened.  He is having more thoughts again about possibly wanting to harm himself although he states that he would never actually follow through and do this.  He is also talking with a therapist.  His PHQ-9 went from a 3 to an 85.  We will  change his sertraline to Lexapro 10 mg daily.  Continue Wellbutrin 150 mg daily.  Follow-up in 4 to 6 weeks, may be virtual.      Relevant Medications   escitalopram (LEXAPRO) 10 MG tablet    Return in about 4 weeks (around 07/28/2021) for 4-6 weeks, Anxiety, Depression may be virtual.    Gerre Scull, NP

## 2021-06-30 NOTE — Assessment & Plan Note (Signed)
Chronic, worsened.  He states that over the past 2 weeks his symptoms of depression have worsened.  He is having more thoughts again about possibly wanting to harm himself although he states that he would never actually follow through and do this.  He is also talking with a therapist.  His PHQ-9 went from a 3 to an 19.  We will change his sertraline to Lexapro 10 mg daily.  Continue Wellbutrin 150 mg daily.  Follow-up in 4 to 6 weeks, may be virtual.

## 2021-06-30 NOTE — Patient Instructions (Signed)
It was great to see you!  Stop your sertraline and start lexapro 1 capsule daily. Keep taking your wellbutrin.   Let's follow-up in 4-6 weeks, sooner if you have concerns.  If a referral was placed today, you will be contacted for an appointment. Please note that routine referrals can sometimes take up to 3-4 weeks to process. Please call our office if you haven't heard anything after this time frame.  Take care,  Rodman Pickle, NP

## 2021-07-19 ENCOUNTER — Encounter: Payer: Self-pay | Admitting: Nurse Practitioner

## 2021-07-20 MED ORDER — ESCITALOPRAM OXALATE 20 MG PO TABS: 20.0000 mg | ORAL_TABLET | Freq: Every day | ORAL | 1 refills | Status: DC

## 2021-10-18 ENCOUNTER — Encounter: Payer: Self-pay | Admitting: Nurse Practitioner

## 2021-10-19 MED ORDER — ESCITALOPRAM OXALATE 20 MG PO TABS: 20.0000 mg | ORAL_TABLET | Freq: Every day | ORAL | 1 refills | Status: DC

## 2021-11-17 ENCOUNTER — Ambulatory Visit (INDEPENDENT_AMBULATORY_CARE_PROVIDER_SITE_OTHER): Payer: 59 | Admitting: Nurse Practitioner

## 2021-11-17 ENCOUNTER — Encounter: Payer: Self-pay | Admitting: Nurse Practitioner

## 2021-11-17 VITALS — BP 116/66 | HR 74 | Temp 98.2°F | Ht 68.0 in | Wt 122.6 lb

## 2021-11-17 DIAGNOSIS — F419 Anxiety disorder, unspecified: Secondary | ICD-10-CM | POA: Diagnosis not present

## 2021-11-17 DIAGNOSIS — F320 Major depressive disorder, single episode, mild: Secondary | ICD-10-CM | POA: Diagnosis not present

## 2021-11-17 DIAGNOSIS — Z23 Encounter for immunization: Secondary | ICD-10-CM

## 2021-11-17 DIAGNOSIS — K5900 Constipation, unspecified: Secondary | ICD-10-CM | POA: Diagnosis not present

## 2021-11-17 NOTE — Patient Instructions (Signed)
It was great to see you!  Try to increase some of the fiber in your diet. Make sure you drink a lot of water.   You can look into a nutritional shake to add once a day: Ensure, boost, premier protein  Let's follow-up in 6 months, sooner if you have concerns.  If a referral was placed today, you will be contacted for an appointment. Please note that routine referrals can sometimes take up to 3-4 weeks to process. Please call our office if you haven't heard anything after this time frame.  Take care,  Rodman Pickle, NP

## 2021-11-17 NOTE — Assessment & Plan Note (Signed)
Chronic, improving.  Continue MiraLAX daily as needed for constipation.  Also discussed with some episodes of diarrhea after eating to limit triggering foods.  He can also increase the fiber in his diet, making sure that he is drinking plenty of fluids.  Follow-up if symptoms worsen or with any concerns.

## 2021-11-17 NOTE — Assessment & Plan Note (Signed)
Chronic, Stable.  His GAD-7 is a 4 today.  Continue Lexapro 20 mg daily.  Follow-up in 6 months.

## 2021-11-17 NOTE — Assessment & Plan Note (Signed)
Chronic, stable.  We will have him continue the Lexapro 20 mg daily.  His PHQ-9 is a 5 today.  Follow-up in 6 months.

## 2021-11-17 NOTE — Progress Notes (Signed)
Established Patient Office Visit  Subjective   Patient ID: Raymond Huber, male    DOB: 14-Sep-1995  Age: 26 y.o. MRN: [id number]  Chief Complaint  Patient presents with   Follow-up    6 month f/u anxiety/depression.  Needs 3rd dose of HPV.      HPI  Gustav Fiebiger is here to follow-up on anxiety and depression.  He states that his symptoms are doing well and he is enjoying therapy.  He is currently just taking Lexapro 20 mg daily.  He is not having any side effects to this medication.  He denies SI/HI.  He states that his constipation has gotten slightly better.  He is taking the MiraLAX when he remembers.  He has noticed though that he is also having some episodes of diarrhea right after eating about 3 times a week.  This tends to happen when he eats spicy foods.  He denies abdominal pain and cramping.     11/17/2021    4:29 PM 06/30/2021    4:23 PM 05/24/2021   10:15 AM 04/01/2021    5:06 PM 03/02/2021    4:10 PM  Depression screen PHQ 2/9  Decreased Interest 1 0 1 3 1   Down, Depressed, Hopeless 0 3 1 1 1   PHQ - 2 Score 1 3 2 4 2   Altered sleeping 2 0 0 1 1  Tired, decreased energy 1 1 1 2 1   Change in appetite 1 1 0 0 0  Feeling bad or failure about yourself  0 2 0 0 1  Trouble concentrating 0 0 0 3 3  Moving slowly or fidgety/restless 0 1 0 0 1  Suicidal thoughts 0 3 0 0 1  PHQ-9 Score 5 11 3 10 10   Difficult doing work/chores Not difficult at all Somewhat difficult Not difficult at all Somewhat difficult Very difficult      11/17/2021    4:29 PM 06/30/2021    4:32 PM 05/24/2021   10:15 AM 04/01/2021    5:06 PM  GAD 7 : Generalized Anxiety Score  Nervous, Anxious, on Edge 1 3 1 1   Control/stop worrying 0 1 0 0  Worry too much - different things 0 3 0 0  Trouble relaxing 1 1 0 1  Restless 1 2 1 1   Easily annoyed or irritable 1 1 0 0  Afraid - awful might happen 0 0 0 0  Total GAD 7 Score 4 11 2 3   Anxiety Difficulty Not difficult at all Somewhat difficult Not  difficult at all Somewhat difficult      ROS See pertinent positives and negatives per HPI.    Objective:     BP 116/66   Pulse 74   Temp 98.2 F (36.8 C) (Temporal)   Ht 5\' 8"  (1.727 m)   Wt 122 lb 9.6 oz (55.6 kg)   SpO2 98%   BMI 18.64 kg/m  BP Readings from Last 3 Encounters:  11/17/21 116/66  06/30/21 102/80  05/24/21 116/66   Wt Readings from Last 3 Encounters:  11/17/21 122 lb 9.6 oz (55.6 kg)  06/30/21 129 lb 6.4 oz (58.7 kg)  05/24/21 129 lb 12.8 oz (58.9 kg)     Physical Exam Vitals and nursing note reviewed.  Constitutional:      Appearance: Normal appearance.  HENT:     Head: Normocephalic.  Eyes:     Conjunctiva/sclera: Conjunctivae normal.  Cardiovascular:     Rate and Rhythm: Normal rate and regular rhythm.  Pulses: Normal pulses.     Heart sounds: Normal heart sounds.  Pulmonary:     Effort: Pulmonary effort is normal.     Breath sounds: Normal breath sounds.  Musculoskeletal:     Cervical back: Normal range of motion.  Skin:    General: Skin is warm.  Neurological:     General: No focal deficit present.     Mental Status: He is alert and oriented to person, place, and time.  Psychiatric:        Mood and Affect: Mood normal.        Behavior: Behavior normal.        Thought Content: Thought content normal.        Judgment: Judgment normal.      Assessment & Plan:   Problem List Items Addressed This Visit       Other   Anxiety    Chronic, Stable.  His GAD-7 is a 4 today.  Continue Lexapro 20 mg daily.  Follow-up in 6 months.      Depression, major, single episode, mild (HCC) - Primary    Chronic, stable.  We will have him continue the Lexapro 20 mg daily.  His PHQ-9 is a 5 today.  Follow-up in 6 months.      Constipation    Chronic, improving.  Continue MiraLAX daily as needed for constipation.  Also discussed with some episodes of diarrhea after eating to limit triggering foods.  He can also increase the fiber in his diet,  making sure that he is drinking plenty of fluids.  Follow-up if symptoms worsen or with any concerns.      Other Visit Diagnoses     Need for HPV vaccination       Relevant Orders   HPV 9-valent vaccine,Recombinat (Completed)       Return in about 6 months (around 05/18/2022) for CPE.    Gerre Scull, NP

## 2021-12-06 ENCOUNTER — Encounter: Payer: Self-pay | Admitting: Nurse Practitioner

## 2022-01-20 ENCOUNTER — Encounter: Payer: Self-pay | Admitting: Nurse Practitioner

## 2022-01-20 MED ORDER — ESCITALOPRAM OXALATE 20 MG PO TABS: 20.0000 mg | ORAL_TABLET | Freq: Every day | ORAL | 1 refills | Status: DC

## 2022-05-18 ENCOUNTER — Encounter: Payer: 59 | Admitting: Nurse Practitioner

## 2022-06-02 ENCOUNTER — Encounter: Payer: Self-pay | Admitting: Nurse Practitioner

## 2022-06-02 ENCOUNTER — Ambulatory Visit (INDEPENDENT_AMBULATORY_CARE_PROVIDER_SITE_OTHER): Payer: 59 | Admitting: Nurse Practitioner

## 2022-06-02 VITALS — BP 118/72 | HR 57 | Temp 97.0°F | Ht 68.0 in | Wt 123.0 lb

## 2022-06-02 DIAGNOSIS — K5900 Constipation, unspecified: Secondary | ICD-10-CM | POA: Diagnosis not present

## 2022-06-02 DIAGNOSIS — F419 Anxiety disorder, unspecified: Secondary | ICD-10-CM

## 2022-06-02 DIAGNOSIS — Z0000 Encounter for general adult medical examination without abnormal findings: Secondary | ICD-10-CM | POA: Diagnosis not present

## 2022-06-02 DIAGNOSIS — F320 Major depressive disorder, single episode, mild: Secondary | ICD-10-CM

## 2022-06-02 MED ORDER — ESCITALOPRAM OXALATE 20 MG PO TABS: 20.0000 mg | ORAL_TABLET | Freq: Every day | ORAL | 3 refills | Status: DC

## 2022-06-02 NOTE — Progress Notes (Signed)
BP 118/72 (BP Location: Left Arm)   Pulse (!) 57   Temp (!) 97 F (36.1 C)   Ht 5\' 8"  (1.727 m)   Wt 123 lb (55.8 kg)   SpO2 99%   BMI 18.70 kg/m    Subjective:    Patient ID: Raymond Huber, male    DOB: [birth year], 27 y.o.   MRN: [id number]  CC: Chief Complaint  Patient presents with   Annual Exam    No labs, No Concerns    HPI: Raymond Huber is a 27 y.o. male presenting on 06/02/2022 for comprehensive medical examination. Current medical complaints include:none  Depression and Anxiety Screen done today and results listed below:     06/02/2022    8:18 AM 11/17/2021    4:29 PM 06/30/2021    4:23 PM 05/24/2021   10:15 AM 04/01/2021    5:06 PM  Depression screen PHQ 2/9  Decreased Interest 0 1 0 1 3  Down, Depressed, Hopeless 0 0 3 1 1   PHQ - 2 Score 0 1 3 2 4   Altered sleeping 1 2 0 0 1  Tired, decreased energy 0 1 1 1 2   Change in appetite 0 1 1 0 0  Feeling bad or failure about yourself  0 0 2 0 0  Trouble concentrating 1 0 0 0 3  Moving slowly or fidgety/restless 0 0 1 0 0  Suicidal thoughts 0 0 3 0 0  PHQ-9 Score 2 5 11 3 10   Difficult doing work/chores Not difficult at all Not difficult at all Somewhat difficult Not difficult at all Somewhat difficult      06/02/2022    8:19 AM 11/17/2021    4:29 PM 06/30/2021    4:32 PM 05/24/2021   10:15 AM  GAD 7 : Generalized Anxiety Score  Nervous, Anxious, on Edge 0 1 3 1   Control/stop worrying 0 0 1 0  Worry too much - different things 0 0 3 0  Trouble relaxing 0 1 1 0  Restless 1 1 2 1   Easily annoyed or irritable 0 1 1 0  Afraid - awful might happen 0 0 0 0  Total GAD 7 Score 1 4 11 2   Anxiety Difficulty Not difficult at all Not difficult at all Somewhat difficult Not difficult at all    The patient does not have a history of falls. I did not complete a risk assessment for falls. A plan of care for falls was not documented.   Past Medical History:  Past Medical History:  Diagnosis Date   Allergy     Anxiety    Constipation    Hemorrhoid     Surgical History:  Past Surgical History:  Procedure Laterality Date   WISDOM TOOTH EXTRACTION Bilateral     Medications:  Current Outpatient Medications on File Prior to Visit  Medication Sig   polyethylene glycol (MIRALAX / GLYCOLAX) 17 g packet Take 17 g by mouth daily.   No current facility-administered medications on file prior to visit.    Allergies:  No Known Allergies  Social History:  Social History   Socioeconomic History   Marital status: Single    Spouse name: Not on file   Number of children: Not on file   Years of education: Not on file   Highest education level: Not on file  Occupational History   Not on file  Tobacco Use   Smoking status: Never   Smokeless tobacco: Never  Vaping Use   Vaping  Use: Former  Substance and Sexual Activity   Alcohol use: Yes    Alcohol/week: 1.0 - 2.0 standard drink of alcohol    Types: 1 - 2 Cans of beer per week    Comment: socially   Drug use: Yes    Types: Marijuana    Comment: CBD   Sexual activity: Yes  Other Topics Concern   Not on file  Social History Narrative   Not on file   Social Determinants of Health   Financial Resource Strain: Not on file  Food Insecurity: Not on file  Transportation Needs: Not on file  Physical Activity: Not on file  Stress: Not on file  Social Connections: Not on file  Intimate Partner Violence: Not on file   Social History   Tobacco Use  Smoking Status Never  Smokeless Tobacco Never   Social History   Substance and Sexual Activity  Alcohol Use Yes   Alcohol/week: 1.0 - 2.0 standard drink of alcohol   Types: 1 - 2 Cans of beer per week   Comment: socially    Family History:  Family History  Problem Relation Age of Onset   Mood Disorder Father    Cancer Paternal Grandmother        breast   Cancer Paternal Grandfather        pancreatic    Past medical history, surgical history, medications, allergies, family  history and social history reviewed with patient today and changes made to appropriate areas of the chart.   Review of Systems  Constitutional:  Negative for chills and fever.  HENT: Negative.  Negative for ear discharge, ear pain, hearing loss and sinus pain.   Eyes:  Negative for blurred vision, pain, discharge and redness.  Respiratory:  Negative for cough, shortness of breath and wheezing.   Cardiovascular:  Negative for chest pain and palpitations.  Gastrointestinal:  Negative for constipation, heartburn, nausea and vomiting.       Eating fiber bars   Genitourinary:  Negative for frequency and urgency.  Musculoskeletal:  Negative for joint pain and neck pain.  Skin: Negative.  Negative for itching and rash.  Neurological:  Negative for dizziness and headaches.  Endo/Heme/Allergies:  Does not bruise/bleed easily.  Psychiatric/Behavioral:  Negative for suicidal ideas. The patient is not nervous/anxious and does not have insomnia.    All other ROS negative except what is listed above and in the HPI.      Objective:    BP 118/72 (BP Location: Left Arm)   Pulse (!) 57   Temp (!) 97 F (36.1 C)   Ht 5\' 8"  (1.727 m)   Wt 123 lb (55.8 kg)   SpO2 99%   BMI 18.70 kg/m   Wt Readings from Last 3 Encounters:  06/02/22 123 lb (55.8 kg)  11/17/21 122 lb 9.6 oz (55.6 kg)  06/30/21 129 lb 6.4 oz (58.7 kg)    Physical Exam Constitutional:      Appearance: Normal appearance. He is normal weight.  HENT:     Head: Normocephalic and atraumatic.     Right Ear: Tympanic membrane, ear canal and external ear normal.     Left Ear: Tympanic membrane, ear canal and external ear normal.     Nose: Nose normal. No congestion or rhinorrhea.     Mouth/Throat:     Mouth: Mucous membranes are moist.     Pharynx: No oropharyngeal exudate or posterior oropharyngeal erythema.  Cardiovascular:     Rate and Rhythm: Normal rate and  regular rhythm.     Pulses: Normal pulses.     Heart sounds: Normal  heart sounds. No murmur heard.    No friction rub. No gallop.  Pulmonary:     Effort: Pulmonary effort is normal. No respiratory distress.     Breath sounds: Normal breath sounds. No wheezing.  Abdominal:     General: Abdomen is flat. There is no distension.     Palpations: There is no mass.     Tenderness: There is no abdominal tenderness.  Musculoskeletal:        General: No swelling or tenderness. Normal range of motion.     Cervical back: Normal range of motion.  Skin:    General: Skin is warm and dry.     Capillary Refill: Capillary refill takes 2 to 3 seconds.  Neurological:     Mental Status: He is alert and oriented to person, place, and time.  Psychiatric:        Mood and Affect: Mood normal.        Behavior: Behavior normal.        Thought Content: Thought content normal.        Judgment: Judgment normal.     Results for orders placed or performed in visit on 04/01/21  CBC  Result Value Ref Range   WBC 6.2 3.8 - 10.8 Thousand/uL   RBC 4.79 4.20 - 5.80 Million/uL   Hemoglobin 14.0 13.2 - 17.1 g/dL   HCT 40.9 81.1 - 91.4 %   MCV 88.1 80.0 - 100.0 fL   MCH 29.2 27.0 - 33.0 pg   MCHC 33.2 32.0 - 36.0 g/dL   RDW 78.2 95.6 - 21.3 %   Platelets 203 140 - 400 Thousand/uL   MPV 10.5 7.5 - 12.5 fL  Comprehensive metabolic panel  Result Value Ref Range   Glucose, Bld 86 65 - 99 mg/dL   BUN 9 7 - 25 mg/dL   Creat 0.86 5.78 - 4.69 mg/dL   BUN/Creatinine Ratio NOT APPLICABLE 6 - 22 (calc)   Sodium 139 135 - 146 mmol/L   Potassium 4.4 3.5 - 5.3 mmol/L   Chloride 101 98 - 110 mmol/L   CO2 24 20 - 32 mmol/L   Calcium 10.0 8.6 - 10.3 mg/dL   Total Protein 7.2 6.1 - 8.1 g/dL   Albumin 5.0 3.6 - 5.1 g/dL   Globulin 2.2 1.9 - 3.7 g/dL (calc)   AG Ratio 2.3 1.0 - 2.5 (calc)   Total Bilirubin 0.5 0.2 - 1.2 mg/dL   Alkaline phosphatase (APISO) 59 36 - 130 U/L   AST 17 10 - 40 U/L   ALT 15 9 - 46 U/L  Lipid panel  Result Value Ref Range   Cholesterol 128 <200 mg/dL   HDL  53 > OR = 40 mg/dL   Triglycerides 84 <629 mg/dL   LDL Cholesterol (Calc) 59 mg/dL (calc)   Total CHOL/HDL Ratio 2.4 <5.0 (calc)   Non-HDL Cholesterol (Calc) 75 <528 mg/dL (calc)  TSH  Result Value Ref Range   TSH 1.44 0.40 - 4.50 mIU/L      Assessment & Plan:   Problem List Items Addressed This Visit       Other   Anxiety    Chronic (stable). Continue Lexapro 20 mg daily.       Relevant Medications   escitalopram (LEXAPRO) 20 MG tablet   Depression, major, single episode, mild (HCC)    Chronic (stable). Continue Lexapro 20 mg daily.  Relevant Medications   escitalopram (LEXAPRO) 20 MG tablet   Constipation    Chronic (stable). Continue miralax and fiber intact. Encourage to drink plenty of water.       Other Visit Diagnoses     Routine general medical examination at a health care facility    -  Primary   Completed annual physical examination. No acute findngs. No concerns at this time. Return in about 1 year for next preventative physical examination.        IMMUNIZATIONS:   - Tdap: Tetanus vaccination status reviewed: last tetanus booster within 10 years. - Influenza: Up to date - Pneumovax: Not applicable - Prevnar: Not applicable - HPV: Up to date - Zostavax vaccine: Not applicable  SCREENING: - Colonoscopy: Not applicable  Discussed with patient purpose of the colonoscopy is to detect colon cancer at curable precancerous or early stages   - AAA Screening: Not applicable   PATIENT COUNSELING:    Sexuality: Discussed sexually transmitted diseases, partner selection, use of condoms, avoidance of unintended pregnancy  and contraceptive alternatives.   Advised to avoid cigarette smoking.  I discussed with the patient that most people either abstain from alcohol or drink within safe limits (<=14/week and <=4 drinks/occasion for males, <=7/weeks and <= 3 drinks/occasion for females) and that the risk for alcohol disorders and other health effects rises  proportionally with the number of drinks per week and how often a drinker exceeds daily limits.  Discussed cessation/primary prevention of drug use and availability of treatment for abuse.   Diet: Encouraged to adjust caloric intake to maintain  or achieve ideal body weight, to reduce intake of dietary saturated fat and total fat, to limit sodium intake by avoiding high sodium foods and not adding table salt, and to maintain adequate dietary potassium and calcium preferably from fresh fruits, vegetables, and low-fat dairy products.    stressed the importance of regular exercise  Injury prevention: Discussed safety belts, safety helmets, smoke detector, smoking near bedding or upholstery.   Dental health: Discussed importance of regular tooth brushing, flossing, and dental visits.   Follow up plan: NEXT PREVENTATIVE PHYSICAL DUE IN 1 YEAR. Return in about 1 year (around 06/02/2023) for CPE.

## 2022-06-02 NOTE — Patient Instructions (Signed)
It was great to see you!  I have refilled your lexapro.   Keep up the great work!  Let's follow-up in 1 year, sooner if you have concerns.  If a referral was placed today, you will be contacted for an appointment. Please note that routine referrals can sometimes take up to 3-4 weeks to process. Please call our office if you haven't heard anything after this time frame.  Take care,  Rodman Pickle, NP

## 2022-06-02 NOTE — Assessment & Plan Note (Signed)
Chronic (stable). Continue miralax and fiber intact. Encourage to drink plenty of water.

## 2022-06-02 NOTE — Assessment & Plan Note (Signed)
Chronic (stable). Continue Lexapro 20 mg daily.

## 2022-06-02 NOTE — Assessment & Plan Note (Signed)
Chronic (stable). Continue Lexapro 20 mg daily. 

## 2022-08-01 ENCOUNTER — Encounter: Payer: Self-pay | Admitting: Nurse Practitioner

## 2022-08-18 ENCOUNTER — Telehealth: Payer: 59 | Admitting: Nurse Practitioner

## 2022-08-18 ENCOUNTER — Encounter: Payer: Self-pay | Admitting: Nurse Practitioner

## 2022-08-18 DIAGNOSIS — F419 Anxiety disorder, unspecified: Secondary | ICD-10-CM

## 2022-08-18 DIAGNOSIS — F320 Major depressive disorder, single episode, mild: Secondary | ICD-10-CM

## 2022-08-18 NOTE — Progress Notes (Signed)
[facility] [address] [place] [address] Dept: [phone] Dept Fax: [phone]  Virtual Video Visit  I connected with Rubye Oaks on 08/18/22 at 11:20 AM EDT by a video enabled telemedicine application and verified that I am speaking with the correct person using two identifiers.  Location patient: Home Location provider: Clinic Persons participating in the virtual visit: Patient; Rodman Pickle, NP; Malena Peer, CMA  I discussed the limitations of evaluation and management by telemedicine and the availability of in person appointments. The patient expressed understanding and agreed to proceed.  Chief Complaint  Patient presents with   Anxiety    SUBJECTIVE:  HPI: Raymond Huber is a 27 y.o. male who presents to follow-up on anxiety.  He states that the Lexapro 20 mg daily is working well to help with his anxiety and depression.  He states that he does still have some anxiety but overall it is well-controlled.  He denies SI/HI.  He did note that in July, he woke up when his alarm went off at 7:30 AM, however fell back asleep and did not wake up till 9:30 AM he states that he felt like he could not control this and would just go back to sleep on his own.  He ended up being late for work 4 or 5 times.  He states that he ended up pushing back his bedtime by 30 minutes and has not had any issues since then.  He is still interested in getting intermittent FMLA just in case this were to happen again.  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   Constipation 03/03/2021    Past Surgical History:  Procedure Laterality Date   WISDOM TOOTH EXTRACTION Bilateral     Family History  Problem Relation Age of Onset   Mood Disorder Father    Cancer Paternal Grandmother        breast   Cancer Paternal Grandfather         pancreatic    Social History   Tobacco Use   Smoking status: Never   Smokeless tobacco: Never  Vaping Use   Vaping status: Former  Substance Use Topics   Alcohol use: Yes    Alcohol/week: 1.0 - 2.0 standard drink of alcohol    Types: 1 - 2 Cans of beer per week    Comment: socially   Drug use: Yes    Types: Marijuana    Comment: CBD     Current Outpatient Medications:    escitalopram (LEXAPRO) 20 MG tablet, Take 1 tablet (20 mg total) by mouth daily., Disp: 90 tablet, Rfl: 3   polyethylene glycol (MIRALAX / GLYCOLAX) 17 g packet, Take 17 g by mouth daily., Disp: , Rfl:   No Known Allergies  ROS: See pertinent positives and negatives per HPI.  OBSERVATIONS/OBJECTIVE:  VITALS per patient if applicable: There were no vitals filed for this visit. There is no height or weight on file to calculate BMI.    GENERAL: Alert and oriented. Appears well and in no acute distress.  HEENT: Atraumatic. Conjunctiva clear. No obvious abnormalities on inspection of external nose and ears.  NECK: Normal movements of the head and neck.  LUNGS: On inspection, no signs of respiratory distress. Breathing rate appears normal. No obvious gross SOB, gasping or wheezing, and no conversational dyspnea.  CV: No obvious cyanosis.  MS: Moves all visible extremities without  noticeable abnormality.  PSYCH/NEURO: Pleasant and cooperative. No obvious depression or anxiety. Speech and thought processing grossly intact.  ASSESSMENT AND PLAN:  Problem List Items Addressed This Visit       Other   Anxiety    Chronic, stable.  Continue Lexapro 20 mg daily.  He states that overall he is doing really well right now.  He did have 4-5 episodes in July when he woke up at 7:30 AM when his alarm went off but then fell back asleep and woke up at 9:30 AM.  He states that he felt like he could not control this.  He has pushes bedtime back by 30 minutes and has not had any issues since then.  Will have him  continue Lexapro 20 mg daily.  Will also fill out FMLA forms for intermittent use for any ongoing issues related to his depression or anxiety.  Follow-up at neck scheduled appointment.      Depression, major, single episode, mild (HCC) - Primary    Chronic, stable.  Continue Lexapro 20 mg daily.  He states that overall he is doing really well right now.  He did have 4-5 episodes in July when he woke up at 7:30 AM when his alarm went off but then fell back asleep and woke up at 9:30 AM.  He states that he felt like he could not control this.  He has pushes bedtime back by 30 minutes and has not had any issues since then.  Will have him continue Lexapro 20 mg daily.  Will also fill out FMLA forms for intermittent use for any ongoing issues related to his depression or anxiety.  Follow-up at neck scheduled appointment.        I discussed the assessment and treatment plan with the patient. The patient was provided an opportunity to ask questions and all were answered. The patient agreed with the plan and demonstrated an understanding of the instructions.   The patient was advised to call back or seek an in-person evaluation if the symptoms worsen or if the condition fails to improve as anticipated.   Gerre Scull, NP

## 2022-08-18 NOTE — Patient Instructions (Signed)
It was great to see you!  Continue taking lexapro 20mg  daily.  I am working on your FMLA paperwork and will fax it to the appropriate people. We will also let you know when it is completed.   Let's follow-up at your next scheduled visit.   Take care,  Rodman Pickle, NP

## 2022-08-18 NOTE — Assessment & Plan Note (Signed)
Chronic, stable.  Continue Lexapro 20 mg daily.  He states that overall he is doing really well right now.  He did have 4-5 episodes in July when he woke up at 7:30 AM when his alarm went off but then fell back asleep and woke up at 9:30 AM.  He states that he felt like he could not control this.  He has pushes bedtime back by 30 minutes and has not had any issues since then.  Will have him continue Lexapro 20 mg daily.  Will also fill out FMLA forms for intermittent use for any ongoing issues related to his depression or anxiety.  Follow-up at neck scheduled appointment.

## 2022-08-23 ENCOUNTER — Telehealth: Payer: Self-pay

## 2022-08-23 NOTE — Telephone Encounter (Signed)
  CLINICAL USE BELOW THIS LINE (use X to signify action taken)  ___ Form received and placed in providers office for signature. ___ Form completed and faxed to LOA Dept.  __x_ Form completed & LVM to notify patient ready for pick up.  ___ Charge sheet and copy of form in front office folder for office supervisor.    LVM for patient to return call.

## 2022-08-24 NOTE — Telephone Encounter (Signed)
I called and spoke with patient and notified him that forms are completed and ready to pick up.

## 2023-08-27 ENCOUNTER — Other Ambulatory Visit: Payer: Self-pay | Admitting: Nurse Practitioner

## 2023-11-13 ENCOUNTER — Ambulatory Visit (INDEPENDENT_AMBULATORY_CARE_PROVIDER_SITE_OTHER): Admitting: Nurse Practitioner

## 2023-11-13 ENCOUNTER — Encounter: Payer: Self-pay | Admitting: Nurse Practitioner

## 2023-11-13 VITALS — BP 110/60 | HR 68 | Ht 68.0 in | Wt 119.8 lb

## 2023-11-13 DIAGNOSIS — F320 Major depressive disorder, single episode, mild: Secondary | ICD-10-CM | POA: Diagnosis not present

## 2023-11-13 DIAGNOSIS — Z23 Encounter for immunization: Secondary | ICD-10-CM | POA: Diagnosis not present

## 2023-11-13 DIAGNOSIS — Z0000 Encounter for general adult medical examination without abnormal findings: Secondary | ICD-10-CM | POA: Insufficient documentation

## 2023-11-13 DIAGNOSIS — F419 Anxiety disorder, unspecified: Secondary | ICD-10-CM | POA: Diagnosis not present

## 2023-11-13 DIAGNOSIS — R636 Underweight: Secondary | ICD-10-CM | POA: Diagnosis not present

## 2023-11-13 DIAGNOSIS — K5900 Constipation, unspecified: Secondary | ICD-10-CM

## 2023-11-13 MED ORDER — ESCITALOPRAM OXALATE 20 MG PO TABS: 20.0000 mg | ORAL_TABLET | Freq: Every day | ORAL | 3 refills | Status: AC

## 2023-11-13 NOTE — Assessment & Plan Note (Signed)
 Health maintenance reviewed and updated. Discussed nutrition, exercise. Follow-up 1 year.

## 2023-11-13 NOTE — Progress Notes (Signed)
 BP 110/60 (BP Location: Left Arm, Patient Position: Sitting, Cuff Size: Normal)   Pulse 68   Ht 5' 8 (1.727 m)   Wt 119 lb 12.8 oz (54.3 kg)   SpO2 99%   BMI 18.22 kg/m    Subjective:    Patient ID: Raymond Huber, male    DOB: 1995-06-07, 28 y.o.   MRN: [id number]  CC: Chief Complaint  Patient presents with   Annual Exam    No concerns, Flu Vaccine, Rx refill    HPI: [name] is a 28 y.o. male presenting on 11/13/2023 for comprehensive medical examination. Current medical complaints include:none  Depression and Anxiety Screen done today and results listed below:     11/13/2023    9:57 AM 06/02/2022    8:18 AM 11/17/2021    4:29 PM 06/30/2021    4:23 PM 05/24/2021   10:15 AM  Depression screen PHQ 2/9  Decreased Interest 0 0 1 0 1  Down, Depressed, Hopeless 0 0 0 3 1  PHQ - 2 Score 0 0 1 3 2   Altered sleeping 0 1 2 0 0  Tired, decreased energy 0 0 1 1 1   Change in appetite 0 0 1 1 0  Feeling bad or failure about yourself  0 0 0 2 0  Trouble concentrating 0 1 0 0 0  Moving slowly or fidgety/restless 0 0 0 1 0  Suicidal thoughts 0 0 0 3 0  PHQ-9 Score 0 2  5  11  3    Difficult doing work/chores Not difficult at all Not difficult at all Not difficult at all Somewhat difficult Not difficult at all     Data saved with a previous flowsheet row definition      11/13/2023    9:58 AM 06/02/2022    8:19 AM 11/17/2021    4:29 PM 06/30/2021    4:32 PM  GAD 7 : Generalized Anxiety Score  Nervous, Anxious, on Edge 1 0 1 3  Control/stop worrying 0 0 0 1  Worry too much - different things 0 0 0 3  Trouble relaxing 0 0 1 1  Restless 1 1 1 2   Easily annoyed or irritable 0 0 1 1  Afraid - awful might happen 0 0 0 0  Total GAD 7 Score 2 1 4 11   Anxiety Difficulty Not difficult at all Not difficult at all Not difficult at all Somewhat difficult    The patient does not have a history of falls. I did not complete a risk assessment for falls. A plan of care for falls  was not documented.   Past Medical History:  Past Medical History:  Diagnosis Date   Allergy    Anxiety    Constipation    Hemorrhoid     Surgical History:  Past Surgical History:  Procedure Laterality Date   WISDOM TOOTH EXTRACTION Bilateral     Medications:  Current Outpatient Medications on File Prior to Visit  Medication Sig   polyethylene glycol (MIRALAX  / GLYCOLAX ) 17 g packet Take 17 g by mouth daily. (Patient taking differently: Take 17 g by mouth as needed.)   No current facility-administered medications on file prior to visit.    Allergies:  No Known Allergies  Social History:  Social History   Socioeconomic History   Marital status: Single    Spouse name: Not on file   Number of children: Not on file   Years of education: Not on file   Highest education level:  Bachelor's degree (e.g., BA, AB, BS)  Occupational History   Not on file  Tobacco Use   Smoking status: Never   Smokeless tobacco: Never  Vaping Use   Vaping status: Former  Substance and Sexual Activity   Alcohol use: Yes    Alcohol/week: 1.0 - 2.0 standard drink of alcohol    Types: 1 - 2 Cans of beer per week    Comment: socially   Drug use: Yes    Types: Marijuana    Comment: CBD   Sexual activity: Yes  Other Topics Concern   Not on file  Social History Narrative   Not on file   Social Drivers of Health   Financial Resource Strain: Low Risk  (11/12/2023)   Overall Financial Resource Strain (CARDIA)    Difficulty of Paying Living Expenses: Not very hard  Food Insecurity: Food Insecurity Present (11/12/2023)   Hunger Vital Sign    Worried About Running Out of Food in the Last Year: Sometimes true    Ran Out of Food in the Last Year: Never true  Transportation Needs: No Transportation Needs (11/12/2023)   PRAPARE - Administrator, Civil Service (Medical): No    Lack of Transportation (Non-Medical): No  Physical Activity: Sufficiently Active (11/12/2023)   Exercise  Vital Sign    Days of Exercise per Week: 6 days    Minutes of Exercise per Session: 120 min  Stress: No Stress Concern Present (11/12/2023)   [employer] of Occupational Health - Occupational Stress Questionnaire    Feeling of Stress: Only a little  Social Connections: Moderately Integrated (11/12/2023)   Social Connection and Isolation Panel    Frequency of Communication with Friends and Family: Three times a week    Frequency of Social Gatherings with Friends and Family: Twice a week    Attends Religious Services: 1 to 4 times per year    Active Member of [employer] or Organizations: Yes    Attends Engineer, Structural: More than 4 times per year    Marital Status: Never married  Catering Manager Violence: Not on file   Social History   Tobacco Use  Smoking Status Never  Smokeless Tobacco Never   Social History   Substance and Sexual Activity  Alcohol Use Yes   Alcohol/week: 1.0 - 2.0 standard drink of alcohol   Types: 1 - 2 Cans of beer per week   Comment: socially    Family History:  Family History  Problem Relation Age of Onset   Mood Disorder Father    Cancer Paternal Grandmother        breast   Cancer Paternal Grandfather        pancreatic    Past medical history, surgical history, medications, allergies, family history and social history reviewed with patient today and changes made to appropriate areas of the chart.   Review of Systems  Constitutional: Negative.   HENT: Negative.    Eyes: Negative.   Respiratory: Negative.    Cardiovascular: Negative.   Gastrointestinal: Negative.   Genitourinary: Negative.   Musculoskeletal: Negative.   Skin: Negative.   Neurological: Negative.   Psychiatric/Behavioral: Negative.     All other ROS negative except what is listed above and in the HPI.      Objective:    BP 110/60 (BP Location: Left Arm, Patient Position: Sitting, Cuff Size: Normal)   Pulse 68   Ht 5' 8 (1.727 m)   Wt 119 lb 12.8 oz  (54.3 kg)  SpO2 99%   BMI 18.22 kg/m   Wt Readings from Last 3 Encounters:  11/13/23 119 lb 12.8 oz (54.3 kg)  06/02/22 123 lb (55.8 kg)  11/17/21 122 lb 9.6 oz (55.6 kg)    Physical Exam Vitals and nursing note reviewed.  Constitutional:      General: He is not in acute distress.    Appearance: Normal appearance.  HENT:     Head: Normocephalic and atraumatic.     Right Ear: Tympanic membrane, ear canal and external ear normal.     Left Ear: Tympanic membrane, ear canal and external ear normal.     Mouth/Throat:     Mouth: Mucous membranes are moist.     Pharynx: No posterior oropharyngeal erythema.  Eyes:     Conjunctiva/sclera: Conjunctivae normal.  Cardiovascular:     Rate and Rhythm: Normal rate and regular rhythm.     Pulses: Normal pulses.     Heart sounds: Normal heart sounds.  Pulmonary:     Effort: Pulmonary effort is normal.     Breath sounds: Normal breath sounds.  Abdominal:     Palpations: Abdomen is soft.     Tenderness: There is no abdominal tenderness.  Musculoskeletal:        General: Normal range of motion.     Cervical back: Normal range of motion and neck supple. No tenderness.     Right lower leg: No edema.     Left lower leg: No edema.  Lymphadenopathy:     Cervical: No cervical adenopathy.  Skin:    General: Skin is warm and dry.  Neurological:     General: No focal deficit present.     Mental Status: He is alert and oriented to person, place, and time.     Cranial Nerves: No cranial nerve deficit.     Coordination: Coordination normal.     Gait: Gait normal.  Psychiatric:        Mood and Affect: Mood normal.        Behavior: Behavior normal.        Thought Content: Thought content normal.        Judgment: Judgment normal.     Results for orders placed or performed in visit on 04/01/21  CBC   Collection Time: 04/01/21  4:52 PM  Result Value Ref Range   WBC 6.2 3.8 - 10.8 Thousand/uL   RBC 4.79 4.20 - 5.80 Million/uL   Hemoglobin  14.0 13.2 - 17.1 g/dL   HCT 57.7 61.4 - 49.9 %   MCV 88.1 80.0 - 100.0 fL   MCH 29.2 27.0 - 33.0 pg   MCHC 33.2 32.0 - 36.0 g/dL   RDW 87.9 88.9 - 84.9 %   Platelets 203 140 - 400 Thousand/uL   MPV 10.5 7.5 - 12.5 fL  Comprehensive metabolic panel   Collection Time: 04/01/21  4:52 PM  Result Value Ref Range   Glucose, Bld 86 65 - 99 mg/dL   BUN 9 7 - 25 mg/dL   Creat 9.30 9.39 - 8.75 mg/dL   BUN/Creatinine Ratio NOT APPLICABLE 6 - 22 (calc)   Sodium 139 135 - 146 mmol/L   Potassium 4.4 3.5 - 5.3 mmol/L   Chloride 101 98 - 110 mmol/L   CO2 24 20 - 32 mmol/L   Calcium 10.0 8.6 - 10.3 mg/dL   Total Protein 7.2 6.1 - 8.1 g/dL   Albumin 5.0 3.6 - 5.1 g/dL   Globulin 2.2 1.9 - 3.7 g/dL (calc)  AG Ratio 2.3 1.0 - 2.5 (calc)   Total Bilirubin 0.5 0.2 - 1.2 mg/dL   Alkaline phosphatase (APISO) 59 36 - 130 U/L   AST 17 10 - 40 U/L   ALT 15 9 - 46 U/L  Lipid panel   Collection Time: 04/01/21  4:52 PM  Result Value Ref Range   Cholesterol 128 <200 mg/dL   HDL 53 > OR = 40 mg/dL   Triglycerides 84 <849 mg/dL   LDL Cholesterol (Calc) 59 mg/dL (calc)   Total CHOL/HDL Ratio 2.4 <5.0 (calc)   Non-HDL Cholesterol (Calc) 75 <869 mg/dL (calc)  TSH   Collection Time: 04/01/21  4:52 PM  Result Value Ref Range   TSH 1.44 0.40 - 4.50 mIU/L      Assessment & Plan:   Problem List Items Addressed This Visit       Other   Anxiety   Chronic, stable. Continue lexapro  20mg  daily. Follow-up in 1 year.       Relevant Medications   escitalopram  (LEXAPRO ) 20 MG tablet   Depression, major, single episode, mild   Chronic, stable. Continue lexapro  20mg  daily. Follow-up in 1 year, sooner with concerns       Relevant Medications   escitalopram  (LEXAPRO ) 20 MG tablet   Constipation   Chronic, stable. Symptoms have improved with increase control of depression and anxiety. Continue miralax  daily as needed.       Underweight   BMI 18.2. Discussed eating regularly. Labs were normal in the past,  anxiety well controlled. Follow-up with any concerns, monitor weight.       Routine general medical examination at a health care facility - Primary   Health maintenance reviewed and updated. Discussed nutrition, exercise. Follow-up 1 year.        Other Visit Diagnoses       Immunization due       Flu vaccine given today   Relevant Orders   Flu vaccine trivalent PF, 6mos and older(Flulaval,Afluria,Fluarix,Fluzone)      IMMUNIZATIONS:   - Tdap: Tetanus vaccination status reviewed: last tetanus booster within 10 years. - Influenza: Administered today - Pneumovax: Not applicable - Prevnar: Not applicable - HPV: Up to date - Shingrix vaccine: Not applicable  SCREENING: - Colonoscopy: Not applicable  Discussed with patient purpose of the colonoscopy is to detect colon cancer at curable precancerous or early stages   - AAA Screening: Not applicable   PATIENT COUNSELING:    Sexuality: Discussed sexually transmitted diseases, partner selection, use of condoms, avoidance of unintended pregnancy  and contraceptive alternatives.   Advised to avoid cigarette smoking.  I discussed with the patient that most people either abstain from alcohol or drink within safe limits (<=14/week and <=4 drinks/occasion for males, <=7/weeks and <= 3 drinks/occasion for females) and that the risk for alcohol disorders and other health effects rises proportionally with the number of drinks per week and how often a drinker exceeds daily limits.  Discussed cessation/primary prevention of drug use and availability of treatment for abuse.   Diet: Encouraged to adjust caloric intake to maintain  or achieve ideal body weight, to reduce intake of dietary saturated fat and total fat, to limit sodium intake by avoiding high sodium foods and not adding table salt, and to maintain adequate dietary potassium and calcium preferably from fresh fruits, vegetables, and low-fat dairy products.    stressed the importance  of regular exercise  Injury prevention: Discussed safety belts, safety helmets, smoke detector, smoking near bedding or upholstery.  Dental health: Discussed importance of regular tooth brushing, flossing, and dental visits.   Follow up plan: NEXT PREVENTATIVE PHYSICAL DUE IN 1 YEAR. Return in about 1 year (around 11/12/2024) for CPE.  Monette Omara A Junita Kubota

## 2023-11-13 NOTE — Assessment & Plan Note (Signed)
 BMI 18.2. Discussed eating regularly. Labs were normal in the past, anxiety well controlled. Follow-up with any concerns, monitor weight.

## 2023-11-13 NOTE — Assessment & Plan Note (Signed)
 Chronic, stable. Symptoms have improved with increase control of depression and anxiety. Continue miralax  daily as needed.

## 2023-11-13 NOTE — Assessment & Plan Note (Signed)
 Chronic, stable. Continue lexapro  20mg  daily. Follow-up in 1 year.

## 2023-11-13 NOTE — Patient Instructions (Signed)
 It was great to see you!  Keep up the great work!   I have refilled your lexapro    Let's follow-up in 1 year, sooner if you have concerns.  If a referral was placed today, you will be contacted for an appointment. Please note that routine referrals can sometimes take up to 3-4 weeks to process. Please call our office if you haven't heard anything after this time frame.  Take care,  Tinnie Harada, NP

## 2023-11-13 NOTE — Assessment & Plan Note (Signed)
 Chronic, stable. Continue lexapro  20mg  daily. Follow-up in 1 year, sooner with concerns

## 2024-11-13 ENCOUNTER — Encounter: Admitting: Nurse Practitioner
